# Patient Record
Sex: Female | Born: 1935 | Race: White | Hispanic: No | Smoking: Never smoker
Health system: Southern US, Community
[De-identification: ages and names within clinical notes are randomized; demographics above are authoritative.]

## PROBLEM LIST (undated history)

## (undated) DIAGNOSIS — I639 Cerebral infarction, unspecified: Secondary | ICD-10-CM

## (undated) DIAGNOSIS — K5792 Diverticulitis of intestine, part unspecified, without perforation or abscess without bleeding: Secondary | ICD-10-CM

## (undated) DIAGNOSIS — I10 Essential (primary) hypertension: Secondary | ICD-10-CM

## (undated) DIAGNOSIS — I2510 Atherosclerotic heart disease of native coronary artery without angina pectoris: Secondary | ICD-10-CM

## (undated) DIAGNOSIS — G8929 Other chronic pain: Secondary | ICD-10-CM

## (undated) DIAGNOSIS — M1990 Unspecified osteoarthritis, unspecified site: Secondary | ICD-10-CM

## (undated) DIAGNOSIS — N390 Urinary tract infection, site not specified: Secondary | ICD-10-CM

## (undated) DIAGNOSIS — K219 Gastro-esophageal reflux disease without esophagitis: Secondary | ICD-10-CM

## (undated) DIAGNOSIS — M549 Dorsalgia, unspecified: Secondary | ICD-10-CM

## (undated) DIAGNOSIS — E079 Disorder of thyroid, unspecified: Secondary | ICD-10-CM

## (undated) DIAGNOSIS — F0280 Dementia in other diseases classified elsewhere without behavioral disturbance: Secondary | ICD-10-CM

## (undated) DIAGNOSIS — F419 Anxiety disorder, unspecified: Secondary | ICD-10-CM

## (undated) DIAGNOSIS — G309 Alzheimer's disease, unspecified: Secondary | ICD-10-CM

## (undated) HISTORY — PX: ABDOMINAL HYSTERECTOMY: SHX81

## (undated) HISTORY — PX: MASTECTOMY: SHX3

## (undated) HISTORY — PX: APPENDECTOMY: SHX54

## (undated) HISTORY — PX: BREAST SURGERY: SHX581

## (undated) HISTORY — PX: BREAST CYST EXCISION: SHX579

## (undated) HISTORY — PX: HX GALL BLADDER SURGERY/CHOLE: SHX55

## (undated) HISTORY — DX: Essential (primary) hypertension: I10

## (undated) HISTORY — DX: Diverticulitis of intestine, part unspecified, without perforation or abscess without bleeding: K57.92

## (undated) HISTORY — DX: Cerebral infarction, unspecified (CMS HCC): I63.9

## (undated) HISTORY — PX: HIP SURGERY: SHX245

## (undated) HISTORY — PX: EYE SURGERY: SHX253

## (undated) HISTORY — PX: HX HYSTERECTOMY: SHX81

---

## 1998-04-29 ENCOUNTER — Ambulatory Visit (HOSPITAL_COMMUNITY): Payer: Self-pay

## 2000-11-22 ENCOUNTER — Ambulatory Visit (HOSPITAL_COMMUNITY): Payer: Self-pay

## 2005-11-03 ENCOUNTER — Ambulatory Visit: Payer: Self-pay | Admitting: Family Medicine

## 2005-12-01 ENCOUNTER — Ambulatory Visit: Payer: Self-pay | Admitting: Family Medicine

## 2005-12-14 ENCOUNTER — Ambulatory Visit: Payer: Self-pay | Admitting: Family Medicine

## 2005-12-23 ENCOUNTER — Ambulatory Visit: Payer: Self-pay | Admitting: Family Medicine

## 2005-12-30 ENCOUNTER — Ambulatory Visit: Payer: Self-pay | Admitting: Family Medicine

## 2006-01-27 ENCOUNTER — Ambulatory Visit: Payer: Self-pay | Admitting: Family Medicine

## 2006-02-12 ENCOUNTER — Ambulatory Visit: Payer: Self-pay | Admitting: Family Medicine

## 2006-03-04 ENCOUNTER — Ambulatory Visit: Payer: Self-pay | Admitting: Family Medicine

## 2006-08-12 ENCOUNTER — Ambulatory Visit: Payer: Self-pay | Admitting: Family Medicine

## 2006-08-18 ENCOUNTER — Ambulatory Visit: Payer: Self-pay | Admitting: Family Medicine

## 2006-09-21 DIAGNOSIS — I80299 Phlebitis and thrombophlebitis of other deep vessels of unspecified lower extremity: Secondary | ICD-10-CM | POA: Insufficient documentation

## 2006-09-21 DIAGNOSIS — E039 Hypothyroidism, unspecified: Secondary | ICD-10-CM | POA: Insufficient documentation

## 2006-09-21 DIAGNOSIS — I10 Essential (primary) hypertension: Secondary | ICD-10-CM | POA: Insufficient documentation

## 2010-03-03 ENCOUNTER — Encounter: Admission: RE | Admit: 2010-03-03 | Discharge: 2010-03-03 | Payer: Self-pay

## 2011-08-31 ENCOUNTER — Inpatient Hospital Stay (INDEPENDENT_AMBULATORY_CARE_PROVIDER_SITE_OTHER)
Admission: RE | Admit: 2011-08-31 | Discharge: 2011-08-31 | Disposition: A | Discharge status: Home / Self Care | Payer: Medicare Other | Source: Ambulatory Visit | Attending: Family Medicine | Admitting: Family Medicine

## 2011-08-31 ENCOUNTER — Encounter: Payer: Self-pay | Admitting: Family Medicine

## 2011-08-31 ENCOUNTER — Ambulatory Visit
Admission: RE | Admit: 2011-08-31 | Discharge: 2011-08-31 | Disposition: A | Discharge status: Home / Self Care | Payer: Medicare Other | Source: Ambulatory Visit | Attending: Family Medicine | Admitting: Family Medicine

## 2011-08-31 ENCOUNTER — Other Ambulatory Visit: Payer: Self-pay | Admitting: Family Medicine

## 2011-08-31 DIAGNOSIS — S79919A Unspecified injury of unspecified hip, initial encounter: Secondary | ICD-10-CM

## 2011-08-31 DIAGNOSIS — S79929A Unspecified injury of unspecified thigh, initial encounter: Secondary | ICD-10-CM

## 2011-08-31 DIAGNOSIS — M25559 Pain in unspecified hip: Secondary | ICD-10-CM

## 2011-08-31 DIAGNOSIS — I10 Essential (primary) hypertension: Secondary | ICD-10-CM | POA: Insufficient documentation

## 2011-08-31 LAB — CONVERTED CEMR LAB: Blood Glucose, Fingerstick: 130

## 2011-11-16 NOTE — Progress Notes (Signed)
Summary: BILAT HIP PAIN, SOB....WSE(rm 3)   Vital Signs:  Patient Profile:   77 Years Old Female CC:      hip pain post fall last night , SOB x this AM O2 treatment:    Room Air Temp:     97.9 degrees F oral Pulse rate:   88 / minute Resp:     28 per minute BP sitting:   138 / 86  (left arm) Cuff size:   regular  Pt. in pain?   yes    Location:   bilateral hips  Vitals Entered By: Lajean Saver RN (August 31, 2011 1:14 PM)              Is Patient Diabetic? No CBG Result 130      Updated Prior Medication List: CLONAZEPAM 1 MG TABS (CLONAZEPAM) once daily at bedtime COUMADIN  TABS (WARFARIN SODIUM TABS) as prescribed DETROL LA 4 MG CP24 (TOLTERODINE TARTRATE) one with dinner DURAGESIC-25 25 MCG/HR PT72 (FENTANYL) one patch every 3 days LISINOPRIL-HYDROCHLOROTHIAZIDE 20-12.5 MG TABS (LISINOPRIL-HYDROCHLOROTHIAZIDE) once daily LEXAPRO  TABS (ESCITALOPRAM OXALATE TABS) 15 mg once daily LORCET PLUS 7.5-650 MG TABS (HYDROCODONE-ACETAMINOPHEN) one every 4 hours as needed MAGNESIUM CHLORIDE  FLAK (MAGNESIUM CHLORIDE) two times a day PREMARIN 1.25 MG TABS (ESTROGENS CONJUGATED) 1/2 tablet daily PREVACID 30 MG CPDR (LANSOPRAZOLE) once daily QUESTRAN  PACK (CHOLESTYRAMINE PACK) two times a day SEROQUEL 25 MG TABS (QUETIAPINE FUMARATE) one at bedtime SYNTHROID 88 MCG TABS (LEVOTHYROXINE SODIUM) once daily XALATAN 0.005 % SOLN (LATANOPROST) 1 drop each eye  Current Allergies (reviewed today): ! SULFA ! FURADANTIN ! ERYTHROMYCIN ! LINCOMYCIN HCL (LINCOMYCIN HCL) ! VIBRAMYCIN ! MINOCIN ! MORPHINEHistory of Present Illness Chief Complaint: hip pain post fall last night , SOB x this AM History of Present Illness:  Subjective:  Patient presents with her son, who reports that she fell last night while arising from toilet.  She had been sitting for a while and became briefly dizzy as she arose.  She fell, striking her left hip.  She complains of persistent pain in the hip, and has  pain with ambulation.  Her son noted that she seemed short of breath upon walking into clinic this morning, but patient states that she no longer feels dyspneic.  No chest pain.  No nausea/vomiting.  REVIEW OF SYSTEMS Constitutional Symptoms      Denies fever, chills, night sweats, weight loss, weight gain, and fatigue.  Eyes       Denies change in vision, eye pain, eye discharge, glasses, contact lenses, and eye surgery. Ear/Nose/Throat/Mouth       Complains of dizziness.      Denies hearing loss/aids, change in hearing, ear pain, ear discharge, frequent runny nose, frequent nose bleeds, sinus problems, sore throat, hoarseness, and tooth pain or bleeding.  Respiratory       Complains of shortness of breath.      Denies dry cough, productive cough, wheezing, asthma, bronchitis, and emphysema/COPD.  Cardiovascular       Denies murmurs, chest pain, and tires easily with exhertion.    Gastrointestinal       Denies stomach pain, nausea/vomiting, diarrhea, constipation, blood in bowel movements, and indigestion. Genitourniary       Denies painful urination, blood or discharge from vagina, kidney stones, and loss of urinary control. Neurological       Denies paralysis, seizures, and fainting/blackouts. Musculoskeletal       Complains of muscle pain, joint pain, joint stiffness, decreased range of motion,  and swelling.      Denies redness, muscle weakness, and gout.  Skin       Complains of bruising.      Denies unusual mles/lumps or sores and hair/skin or nail changes.      Comments: left hip Psych       Denies mood changes, temper/anger issues, anxiety/stress, speech problems, depression, and sleep problems. Other Comments: Patient fell early this AM landing on back. Denies hitting her head, no soreness upon palpation. She appears SOB, and pale. C/o some nausea.  Denies any CP. Bruising on her left hip. EKG and CBG done   Past History:  Past Medical History: DVT 10/06 on  coumadin Hypertension Hypothyroidism  Past Surgical History: Reviewed history from 09/21/2006 and no changes required. 3 breast surgeries, Appendectomy, Cholecystectomy, Eye sx, Hand sx, Hysterectomy - Partial  Family History: Reviewed history from 09/21/2006 and no changes required. Father, Mother-lung Ca  Social History: Retired.  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  Never smoked, no EtOH, no drugs, 4 caffeinated drinks per day, no regular exercise.  Husband with throat Cancer and Merkle Cell, and Sweet's Syndrome.   Objective:  Appearance:  Elderly female, alert and in no acute distress.  However she has pain in left hip when re-positionioning on exam table. Eyes:  Pupils are equal, round, and reactive to light and accomodation.  Extraocular movement is intact.  Conjunctivae are not inflamed.  Mouth:  moist mucous membranes  Neck:  Supple.  No tenderness  Lungs:  Clear to auscultation.  Breath sounds are equal.  Heart:  Regular rate and rhythm without murmurs, rubs, or gallops.  Chest:  No tenderness Abdomen:  Nontender without masses or hepatosplenomegaly.  Bowel sounds are present.  No CVA or flank tenderness.  Left hip:  Large tender hematoma just posterior to the greater trochanter.  Decreased range of motion hip.   Extremities:  No edema.   EKG:  No acute changes. X-ray left hip:  Negative X-ray pelvis:  IMPRESSION: Moderate degenerative changes involving both hips. No acute fracture.  Assessment New Problems: HYPOTHYROIDISM (ICD-244.9) HYPERTENSION (ICD-401.9) HIP/THIGH INJURY (ICD-959.6) HIP PAIN, LEFT (ICD-719.45)  LARGE HEMATOMA LEFT HIP AREA, MAY NEED ASPIRATION  Plan New Orders: T-DG Hip Complete*L* [13086] T-DG Pelvis 1-2 Views [72170] New Patient Level IV [57846] Planning Comments:   Continue applying ice pack several times daily.  Continue analgesic as needed. Refer to orthopedist.   The patient and/or caregiver has been counseled thoroughly with regard to  medications prescribed including dosage, schedule, interactions, rationale for use, and possible side effects and they verbalize understanding.  Diagnoses and expected course of recovery discussed and will return if not improved as expected or if the condition worsens. Patient and/or caregiver verbalized understanding.   Orders Added: 1)  T-DG Hip Complete*L* [73510] 2)  T-DG Pelvis 1-2 Views [72170] 3)  New Patient Level IV [96295] Appointment scheduled with orthopaedist. Dr. Tasia Catchings with [facility] on 09/04/2011 @ 2:50

## 2011-11-16 NOTE — Progress Notes (Signed)
Summary: Additional Study  EKG:  No acute changes Donna Christen MD  August 31, 2011 9:45 PM

## 2011-12-06 ENCOUNTER — Emergency Department (INDEPENDENT_AMBULATORY_CARE_PROVIDER_SITE_OTHER)
Admission: EM | Admit: 2011-12-06 | Discharge: 2011-12-06 | Disposition: A | Discharge status: Home / Self Care | Payer: Medicare Other | Source: Home / Self Care | Attending: Emergency Medicine | Admitting: Emergency Medicine

## 2011-12-06 ENCOUNTER — Encounter: Payer: Self-pay | Admitting: Emergency Medicine

## 2011-12-06 DIAGNOSIS — N3000 Acute cystitis without hematuria: Secondary | ICD-10-CM

## 2011-12-06 HISTORY — DX: Unspecified osteoarthritis, unspecified site: M19.90

## 2011-12-06 HISTORY — DX: Disorder of thyroid, unspecified: E07.9

## 2011-12-06 HISTORY — DX: Essential (primary) hypertension: I10

## 2011-12-06 HISTORY — DX: Gastro-esophageal reflux disease without esophagitis: K21.9

## 2011-12-06 HISTORY — DX: Dementia in other diseases classified elsewhere, unspecified severity, without behavioral disturbance, psychotic disturbance, mood disturbance, and anxiety: F02.80

## 2011-12-06 HISTORY — DX: Anxiety disorder, unspecified: F41.9

## 2011-12-06 HISTORY — DX: Alzheimer's disease, unspecified: G30.9

## 2011-12-06 LAB — POCT URINALYSIS DIPSTICK
Bilirubin, UA: NEGATIVE
Blood, UA: NEGATIVE
Glucose, UA: NEGATIVE
Ketones, UA: NEGATIVE
Leukocytes, UA: NEGATIVE
Nitrite, UA: NEGATIVE
Protein, UA: NEGATIVE
Spec Grav, UA: 1.02 (ref 1.005–1.03)
Urobilinogen, UA: 0.2 (ref 0–1)
pH, UA: 5.5 (ref 5–8)

## 2011-12-06 MED ORDER — CIPROFLOXACIN HCL 250 MG PO TABS: 250.0000 mg | ORAL_TABLET | Freq: Two times a day (BID) | ORAL | Status: AC

## 2011-12-06 NOTE — ED Notes (Signed)
Experiencing urinary frequency x 24 hours; q 30 minutes. Was D/Cd from hospital 3 days ago having had pneumonia and dehydration. Has had flu vaccine this season.

## 2011-12-06 NOTE — ED Provider Notes (Signed)
History     CSN: [id number]  Arrival date & time 12/06/11  1242   First MD Initiated Contact with Patient 12/06/11 1322      Chief Complaint  Patient presents with  . Urinary Frequency    (Consider location/radiation/quality/duration/timing/severity/associated sxs/prior treatment) HPI Comments: Patient and husband feel that this is the beginning of a UTI. She has had several in the past. Denies any GYN symptoms. No fever or chills. She has multiple drug allergies as noted, but has taken Cipro in the past without problems.  She had been off twice for pneumonia for 5 days and was discharged from the hospital over a week ago and had been doing very well.  Patient is a 77 y.o. female presenting with frequency. The history is provided by the patient and the spouse.  Urinary Frequency This is a new problem. The current episode started 12 to 24 hours ago. The problem occurs constantly. The problem has been gradually worsening. Pertinent negatives include no chest pain, no abdominal pain, no headaches and no shortness of breath. The symptoms are aggravated by nothing. The symptoms are relieved by nothing. She has tried nothing for the symptoms.    Past Medical History  Diagnosis Date  . Thyroid disease   . Arthritis   . Hypertension   . Anxiety   . Alzheimer disease   . GERD (gastroesophageal reflux disease)     Past Surgical History  Procedure Date  . Appendectomy   . Abdominal hysterectomy   . Mastectomy     No family history on file.  History  Substance Use Topics  . Smoking status: Never Smoker   . Smokeless tobacco: Not on file  . Alcohol Use: No    OB History    Grav Para Term Preterm Abortions TAB SAB Ect Mult Living                  Review of Systems  Constitutional: Negative for fever, chills and appetite change.  HENT: Negative.   Eyes: Negative.   Respiratory: Negative.  Negative for shortness of breath.   Cardiovascular: Negative.  Negative for chest  pain.  Gastrointestinal: Negative.  Negative for abdominal pain.  Genitourinary: Positive for urgency and frequency. Negative for dysuria, hematuria, flank pain, decreased urine volume, vaginal bleeding, vaginal discharge, vaginal pain and pelvic pain.  Musculoskeletal: Negative.   Neurological: Negative for syncope, light-headedness and headaches.  Hematological: Negative.   Psychiatric/Behavioral: Negative for hallucinations and agitation.    Allergies  Doxycycline hyclate; Erythromycin; Lincomycin hcl; Minocycline hcl; Morphine; Nitrofurantoin; and Sulfonamide derivatives  Home Medications   Current Outpatient Rx  Name Route Sig Dispense Refill  . ASPIRIN 81 MG PO TABS Oral Take 81 mg by mouth daily.      . ATENOLOL 50 MG PO TABS Oral Take 50 mg by mouth daily.      Marland Kitchen VITAMIN D 1000 UNITS PO TABS Oral Take 1,000 Units by mouth daily.      . CHOLESTYRAMINE LIGHT 4 G PO PACK Oral Take 4 g by mouth 1 day or 1 dose.      Marland Kitchen CLONAZEPAM 0.5 MG PO TABS Oral Take 0.25 mg by mouth daily.      Marland Kitchen ESCITALOPRAM OXALATE 20 MG PO TABS Oral Take 20 mg by mouth daily.      . FENTANYL 25 MCG/HR TD PT72 Transdermal Place 1 patch onto the skin every 3 (three) days.      Marland Kitchen HYDROCODONE-ACETAMINOPHEN 7.5-650 MG PO TABS Oral  Take 1 tablet by mouth daily.      Marland Kitchen LANSOPRAZOLE 30 MG PO CPDR Oral Take 30 mg by mouth daily.      Marland Kitchen LATANOPROST 0.005 % OP SOLN  1 drop at bedtime.      Marland Kitchen LEVOTHYROXINE SODIUM 112 MCG PO TABS Oral Take 112 mcg by mouth daily.      Marland Kitchen MEMANTINE HCL 10 MG PO TABS Oral Take 10 mg by mouth 2 (two) times daily.      Marland Kitchen POLYETHYLENE GLYCOL 3350 PO PACK Oral Take 17 g by mouth daily.      . WARFARIN SODIUM 2 MG PO TABS Oral Take 2 mg by mouth.      Marland Kitchen CIPROFLOXACIN HCL 250 MG PO TABS Oral Take 1 tablet (250 mg total) by mouth 2 (two) times daily. 14 tablet 0    BP 148/71  Pulse 63  Temp(Src) 98.2 F (36.8 C) (Oral)  Resp 16  Ht 5\' 4"  (1.626 m)  Wt 132 lb (59.875 kg)  BMI 22.66 kg/m2   SpO2 99%  Physical Exam  Nursing note and vitals reviewed. Constitutional: She is oriented to person, place, and time. She appears well-developed and well-nourished. No distress.  HENT:  Mouth/Throat: Oropharynx is clear and moist.  Eyes: No scleral icterus.  Neck: Neck supple.  Cardiovascular: Normal rate, regular rhythm and normal heart sounds.   Pulmonary/Chest: Breath sounds normal.  Abdominal: Soft. She exhibits no mass. There is no hepatosplenomegaly. There is tenderness in the suprapubic area. There is no rebound, no guarding and no CVA tenderness.  Lymphadenopathy:    She has no cervical adenopathy.  Neurological: She is alert and oriented to person, place, and time.  Skin: Skin is warm and dry.    ED Course  Procedures (including critical care time)   Labs Reviewed  POCT URINALYSIS DIPSTICK  URINE CULTURE   Urinalysis dipstick is within normal limits.   1. Acute cystitis       MDM  Discuss with patient and husband. Likely this is an early UTI and will treat with Cipro. See also AVS. Urine culture sent. Patient and husband voiced understanding and agreement with treatment plan.        Lonell Face, MD 12/06/11 [phone]

## 2011-12-08 LAB — URINE CULTURE: Colony Count: 3000

## 2012-08-18 ENCOUNTER — Emergency Department (INDEPENDENT_AMBULATORY_CARE_PROVIDER_SITE_OTHER)
Admission: EM | Admit: 2012-08-18 | Discharge: 2012-08-18 | Disposition: A | Discharge status: Home / Self Care | Payer: Medicare Other | Source: Home / Self Care

## 2012-08-18 DIAGNOSIS — N390 Urinary tract infection, site not specified: Secondary | ICD-10-CM

## 2012-08-18 DIAGNOSIS — R350 Frequency of micturition: Secondary | ICD-10-CM

## 2012-08-18 LAB — POCT URINALYSIS DIP (MANUAL ENTRY)
Bilirubin, UA: NEGATIVE
Blood, UA: NEGATIVE
Glucose, UA: NEGATIVE
Ketones, POC UA: NEGATIVE
Protein Ur, POC: NEGATIVE
Spec Grav, UA: 1.01 (ref 1.005–1.03)
Urobilinogen, UA: 2 (ref 0–1)
pH, UA: 5.5 (ref 5–8)

## 2012-08-18 LAB — POCT CBC W AUTO DIFF (K'VILLE URGENT CARE)

## 2012-08-18 MED ORDER — CIPROFLOXACIN HCL 500 MG PO TABS: 500.0000 mg | ORAL_TABLET | Freq: Two times a day (BID) | ORAL | Status: AC

## 2012-08-18 NOTE — ED Provider Notes (Addendum)
History     CSN: [id number]  Arrival date & time 08/18/12  1257   None     Chief Complaint  Patient presents with  . Urinary Frequency   HPI DYSURIA Onset:  2-3 days  Description: increased urinary frequency, generalized malaise, mild intermittent dizziness.  Modifying factors: Recently had UTI 2 weeks ago that was treated with cipro per pt. Sxs resolved but returned yesterday. Symptoms are similar to previous UTIs.   Symptoms Urgency:  yes Frequency: yes  Hesitancy:  yes Hematuria:  no Flank Pain:  mild Fever: no Nausea/Vomiting:  Mild nausea Missed LMP: no STD exposure: no Discharge: no Irritants: no Rash: no  Red Flags   More than 3 UTI's last 12 months:  This will be 2nd UTI in 12 months  PMH of  Diabetes or Immunosuppression:  no Renal Disease/Calculi: no Urinary Tract Abnormality:  no Instrumentation or Trauma: no    Past Medical History  Diagnosis Date  . Thyroid disease   . Arthritis   . Hypertension   . Anxiety   . Alzheimer disease   . GERD (gastroesophageal reflux disease)     Past Surgical History  Procedure Date  . Appendectomy   . Abdominal hysterectomy   . Mastectomy     History reviewed. No pertinent family history.  History  Substance Use Topics  . Smoking status: Never Smoker   . Smokeless tobacco: Not on file  . Alcohol Use: No    OB History    Grav Para Term Preterm Abortions TAB SAB Ect Mult Living                  Review of Systems  All other systems reviewed and are negative.    Allergies  Doxycycline hyclate; Erythromycin; Lincomycin hcl; Minocycline hcl; Morphine; Nitrofurantoin; and Sulfonamide derivatives  Home Medications   Current Outpatient Rx  Name Route Sig Dispense Refill  . ASPIRIN 81 MG PO TABS Oral Take 81 mg by mouth daily.      . ATENOLOL 50 MG PO TABS Oral Take 50 mg by mouth daily.      Marland Kitchen VITAMIN D 1000 UNITS PO TABS Oral Take 1,000 Units by mouth daily.      . CHOLESTYRAMINE LIGHT 4 G PO  PACK Oral Take 4 g by mouth 1 day or 1 dose.      Marland Kitchen CLONAZEPAM 0.5 MG PO TABS Oral Take 0.25 mg by mouth daily.      Marland Kitchen ESCITALOPRAM OXALATE 20 MG PO TABS Oral Take 20 mg by mouth daily.      . FENTANYL 25 MCG/HR TD PT72 Transdermal Place 1 patch onto the skin every 3 (three) days.      Marland Kitchen GABAPENTIN 100 MG PO CAPS Oral Take 100 mg by mouth 3 (three) times daily.    Marland Kitchen HYDROCODONE-ACETAMINOPHEN 7.5-650 MG PO TABS Oral Take 1 tablet by mouth daily.      Marland Kitchen LANSOPRAZOLE 30 MG PO CPDR Oral Take 30 mg by mouth daily.      Marland Kitchen LATANOPROST 0.005 % OP SOLN  1 drop at bedtime.      Marland Kitchen LEVOTHYROXINE SODIUM 112 MCG PO TABS Oral Take 112 mcg by mouth daily.      Marland Kitchen MEMANTINE HCL 10 MG PO TABS Oral Take 10 mg by mouth 2 (two) times daily.      Marland Kitchen POLYETHYLENE GLYCOL 3350 PO PACK Oral Take 17 g by mouth daily.      Marland Kitchen RIVASTIGMINE TARTRATE 4.5  MG PO CAPS Oral Take 4.5 mg by mouth once.    . WARFARIN SODIUM 2 MG PO TABS Oral Take 2 mg by mouth.        BP 163/85  Pulse 61  Temp 98 F (36.7 C) (Oral)  Resp 20  Ht 5\' 4"  (1.626 m)  Wt 137 lb (62.143 kg)  BMI 23.52 kg/m2  SpO2 97%  Physical Exam  Constitutional: She appears well-developed and well-nourished.  HENT:  Head: Normocephalic and atraumatic.  Eyes: Conjunctivae are normal. Pupils are equal, round, and reactive to light.  Neck: Normal range of motion. Neck supple.  Cardiovascular: Normal rate and regular rhythm.   Pulmonary/Chest: Effort normal and breath sounds normal.  Abdominal: Soft. Bowel sounds are normal.       Mild R sided flank pain    Musculoskeletal: Normal range of motion.  Neurological: She is alert.  Skin: Skin is warm.    ED Course  Procedures (including critical care time)   Labs Reviewed  POCT CBC W AUTO DIFF ([facility]) - Abnormal; Notable for the following:   POCT URINALYSIS DIP (MANUAL ENTRY)  URINE CULTURE   No results found.   1. Frequency of urination   2. Complicated UTI (urinary tract infection)         MDM  Will clinically treat for complicated UTI with cipro x 10 days. Noted multiple medication allergies.   Urine culture.  CBC and physical exam are generally reassuring.  Discussed infectious red flags at length with pt as well as son.  Would consider urology referall +/- CT Abd/Pelvis with IV contrast if this recurs in similar fashion.     The patient and/or caregiver has been counseled thoroughly with regard to treatment plan and/or medications prescribed including dosage, schedule, interactions, rationale for use, and possible side effects and they verbalize understanding. Diagnoses and expected course of recovery discussed and will return if not improved as expected or if the condition worsens. Patient and/or caregiver verbalized understanding.              Doree Albee, MD 08/18/12 1431   Update:  Pt with noted visit 11/2011 for UTI.  This would be 3 UTIs in 12 months.  Formal referral to urology made and pt contacted.     Doree Albee, MD 08/18/12 [phone]

## 2012-08-18 NOTE — ED Notes (Signed)
Arionne feels weak and dizzy for a couple of days. She is urinating on herself before she can make it to the bathroom. She complains of back pain and believes this to be a UTI. She has also had sweats, no fever or chills.

## 2012-08-20 LAB — URINE CULTURE: Colony Count: NO GROWTH

## 2012-08-21 ENCOUNTER — Telehealth: Payer: Self-pay | Admitting: Emergency Medicine

## 2013-06-15 ENCOUNTER — Emergency Department (INDEPENDENT_AMBULATORY_CARE_PROVIDER_SITE_OTHER)
Admission: EM | Admit: 2013-06-15 | Discharge: 2013-06-15 | Disposition: A | Discharge status: Home / Self Care | Payer: Medicare Other | Source: Home / Self Care | Attending: Family Medicine | Admitting: Family Medicine

## 2013-06-15 ENCOUNTER — Encounter: Payer: Self-pay | Admitting: *Deleted

## 2013-06-15 DIAGNOSIS — M545 Low back pain, unspecified: Secondary | ICD-10-CM

## 2013-06-15 DIAGNOSIS — Z9181 History of falling: Secondary | ICD-10-CM

## 2013-06-15 DIAGNOSIS — W19XXXA Unspecified fall, initial encounter: Secondary | ICD-10-CM

## 2013-06-15 HISTORY — DX: Dorsalgia, unspecified: M54.9

## 2013-06-15 HISTORY — DX: Other chronic pain: G89.29

## 2013-06-15 NOTE — ED Provider Notes (Signed)
History    CSN: [id number] Arrival date & time 06/15/13  1113  First MD Initiated Contact with Patient 06/15/13 1149     Chief Complaint  Patient presents with  . Back Pain    HPI  This is an 79 year old female with multiple medical problems who presents today with back pain status post fall. Per report, patient was using the bathroom last night when she accidentally fell off the toilet. Patient is unaware whether or not she hit her head her head are back. Since this point she's had low back pain. Patient has a prior history of degenerative disc disease at multiple steroid injections the past. Patient currently denies any radicular symptoms. Patient also reports a new onset shortness of breath as well as some bilateral lotion the swelling. Patient is noted have a prior history of blood clots. Patient is currently on Coumadin for this. Per the husband, INRs to manage by his wife who is a Engineer, civil (consulting). Minimal to mild shortness of breath. Past Medical History  Diagnosis Date  . Thyroid disease   . Arthritis   . Hypertension   . Anxiety   . Alzheimer disease   . GERD (gastroesophageal reflux disease)    Past Surgical History  Procedure Laterality Date  . Appendectomy    . Abdominal hysterectomy    . Mastectomy     No family history on file. History  Substance Use Topics  . Smoking status: Never Smoker   . Smokeless tobacco: Not on file  . Alcohol Use: No   OB History   Grav Para Term Preterm Abortions TAB SAB Ect Mult Living                 Review of Systems  All other systems reviewed and are negative.    Allergies  Doxycycline hyclate; Erythromycin; Lincomycin hcl; Minocycline hcl; Morphine; Nitrofurantoin; and Sulfonamide derivatives  Home Medications   Current Outpatient Rx  Name  Route  Sig  Dispense  Refill  . aspirin 81 MG tablet   Oral   Take 81 mg by mouth daily.           Marland Kitchen atenolol (TENORMIN) 50 MG tablet   Oral   Take 50 mg by mouth daily.            . cholecalciferol (VITAMIN D) 1000 UNITS tablet   Oral   Take 1,000 Units by mouth daily.           . cholestyramine light (PREVALITE) 4 G packet   Oral   Take 4 g by mouth 1 day or 1 dose.           . clonazePAM (KLONOPIN) 0.5 MG tablet   Oral   Take 0.25 mg by mouth daily.           Marland Kitchen escitalopram (LEXAPRO) 20 MG tablet   Oral   Take 20 mg by mouth daily.           . fentaNYL (DURAGESIC - DOSED MCG/HR) 25 MCG/HR   Transdermal   Place 1 patch onto the skin every 3 (three) days.           Marland Kitchen gabapentin (NEURONTIN) 100 MG capsule   Oral   Take 100 mg by mouth 3 (three) times daily.         . hydrocodone-acetaminophen (LORCET PLUS) 7.5-650 MG per tablet   Oral   Take 1 tablet by mouth daily.           Marland Kitchen  lansoprazole (PREVACID) 30 MG capsule   Oral   Take 30 mg by mouth daily.           Marland Kitchen latanoprost (XALATAN) 0.005 % ophthalmic solution      1 drop at bedtime.           Marland Kitchen levothyroxine (SYNTHROID, LEVOTHROID) 112 MCG tablet   Oral   Take 112 mcg by mouth daily.           . memantine (NAMENDA) 10 MG tablet   Oral   Take 10 mg by mouth 2 (two) times daily.           . polyethylene glycol (MIRALAX / GLYCOLAX) packet   Oral   Take 17 g by mouth daily.           . rivastigmine (EXELON) 4.5 MG capsule   Oral   Take 4.5 mg by mouth once.         . warfarin (COUMADIN) 2 MG tablet   Oral   Take 2 mg by mouth.            There were no vitals taken for this visit. Physical Exam  Constitutional:  Frail Ambulating with a walker  HENT:  Head: Normocephalic and atraumatic.  Eyes: Conjunctivae are normal. Pupils are equal, round, and reactive to light.  Neck: Normal range of motion. Neck supple.  Cardiovascular: Normal rate, regular rhythm and normal heart sounds.   Pulmonary/Chest: Effort normal.  Abdominal: Soft.  Musculoskeletal:  Bilateral lower extremity swelling (2+) Bilateral popliteal tenderness palpation.   Neurological: She is alert.  Skin: Skin is warm.    ED Course  Procedures (including critical care time) Labs Reviewed - No data to display No results found. 1. Fall, initial encounter     MDM  Patient is known to have multiple medical problems. Given recent fall there is some concern for possible fracture versus recurrence of DVT as well as multiple other comorbidities it could be contributing to her presentation. Discussed with both patient and son that patient will need further evaluation ER to assess for any blood clots, fractures, infections. Patient and son expressed understanding.     The patient and/or caregiver has been counseled thoroughly with regard to treatment plan and/or medications prescribed including dosage, schedule, interactions, rationale for use, and possible side effects and they verbalize understanding. Diagnoses and expected course of recovery discussed and will return if not improved as expected or if the condition worsens. Patient and/or caregiver verbalized understanding.       Doree Albee, MD 06/15/13 1154

## 2013-06-15 NOTE — ED Notes (Signed)
Pt c/o low back pain for months. She reports seeing a [facility] MD in [place] for 2 epidural injections recently, xrays and MRI's. She reports falling off of the commode last night with increased back pain and pain and swelling in her legs.  she is unsure of LOC.

## 2013-12-29 ENCOUNTER — Emergency Department (INDEPENDENT_AMBULATORY_CARE_PROVIDER_SITE_OTHER)
Admission: EM | Admit: 2013-12-29 | Discharge: 2013-12-29 | Disposition: A | Discharge status: Home / Self Care | Payer: Medicare Other | Source: Home / Self Care | Attending: Emergency Medicine | Admitting: Emergency Medicine

## 2013-12-29 ENCOUNTER — Encounter: Payer: Self-pay | Admitting: Emergency Medicine

## 2013-12-29 DIAGNOSIS — J209 Acute bronchitis, unspecified: Secondary | ICD-10-CM

## 2013-12-29 HISTORY — DX: Urinary tract infection, site not specified: N39.0

## 2013-12-29 MED ORDER — AZITHROMYCIN 250 MG PO TABS: ORAL_TABLET | ORAL | Status: AC

## 2013-12-29 NOTE — ED Provider Notes (Signed)
CSN: [id number]     Arrival date & time 12/29/13  1402 History   First MD Initiated Contact with Patient 12/29/13 1422     Chief Complaint  Patient presents with  . Fatigue  . Hoarse  . Cough    The history is provided by the patient (And her adult son, who is her caretaker).   URI HISTORY  Victorino DecemberLola is a 80 y.o. female who complains of onset of cold symptoms for 2 days.  Has not tried any medication for this  No chills/sweats No  Fever  +  Nasal congestion No  Discolored Post-nasal drainage No sinus pain/pressure No sore throat  +  Mild cough No wheezing + Hoarseness + chest congestion No hemoptysis No shortness of breath No pleuritic pain  No itchy/red eyes No earache  No nausea No vomiting No abdominal pain No diarrhea  No skin rashes +  Fatigue No myalgias No headache   Reviewed medication list with son. She is no longer on Coumadin. For the past 2 weeks, on low-dose Augmentin daily per urologist/infectious disease specialist, in order to help prevent recurrent UTIs, and this has been effective.--No side effects or allergic reactions noted on Augmentin.    Past Medical History  Diagnosis Date  . Thyroid disease   . Arthritis   . Hypertension   . Anxiety   . Alzheimer disease   . GERD (gastroesophageal reflux disease)   . Chronic back pain   . Recurrent UTI    Past Surgical History  Procedure Laterality Date  . Appendectomy    . Abdominal hysterectomy    . Mastectomy    . Breast surgery     Family History  Problem Relation Age of Onset  . Cancer Mother   . Cancer Father    History  Substance Use Topics  . Smoking status: Never Smoker   . Smokeless tobacco: Not on file  . Alcohol Use: No   OB History   Grav Para Term Preterm Abortions TAB SAB Ect Mult Living                 Review of Systems  All other systems reviewed and are negative.    Allergies  Doxycycline hyclate; Erythromycin; Lincomycin hcl; Minocycline hcl; Morphine;  Nitrofurantoin; and Sulfonamide derivatives  Home Medications   Current Outpatient Rx  Name  Route  Sig  Dispense  Refill  . cholestyramine (QUESTRAN) 4 G packet   Oral   Take 4 g by mouth 3 (three) times daily with meals.         . Probiotic Product (PROBIOTIC DAILY PO)   Oral   Take by mouth.         . zolpidem (AMBIEN) 10 MG tablet   Oral   Take 5 mg by mouth at bedtime as needed for sleep.         Marland Kitchen. aspirin 81 MG tablet   Oral   Take 81 mg by mouth daily.           Marland Kitchen. atenolol (TENORMIN) 50 MG tablet   Oral   Take 50 mg by mouth daily.           Marland Kitchen. azithromycin (ZITHROMAX Z-PAK) 250 MG tablet      Take 2 tablets on day one, then 1 tablet daily on days 2 through 5   1 each   0   . cholecalciferol (VITAMIN D) 1000 UNITS tablet   Oral   Take 1,000 Units by mouth  daily.           . cholestyramine light (PREVALITE) 4 G packet   Oral   Take 4 g by mouth 1 day or 1 dose.           . clonazePAM (KLONOPIN) 0.5 MG tablet   Oral   Take 0.25 mg by mouth daily.           . diclofenac sodium (VOLTAREN) 1 % GEL   Topical   Apply topically.         Marland Kitchen escitalopram (LEXAPRO) 20 MG tablet   Oral   Take 20 mg by mouth daily.           . fentaNYL (DURAGESIC - DOSED MCG/HR) 25 MCG/HR   Transdermal   Place 1 patch onto the skin every 3 (three) days.           Marland Kitchen FIBER PO   Oral   Take by mouth.         . fish oil-omega-3 fatty acids 1000 MG capsule   Oral   Take 2 g by mouth daily.         . furosemide (LASIX) 20 MG tablet   Oral   Take 10 mg by mouth.         . gabapentin (NEURONTIN) 100 MG capsule   Oral   Take 100 mg by mouth 3 (three) times daily.         . hydrocodone-acetaminophen (LORCET PLUS) 7.5-650 MG per tablet   Oral   Take 1 tablet by mouth daily.           . lansoprazole (PREVACID) 30 MG capsule   Oral   Take 30 mg by mouth daily.           Marland Kitchen latanoprost (XALATAN) 0.005 % ophthalmic solution      1 drop at  bedtime.           Marland Kitchen levothyroxine (SYNTHROID, LEVOTHROID) 112 MCG tablet   Oral   Take 112 mcg by mouth daily.           Marland Kitchen losartan (COZAAR) 50 MG tablet   Oral   Take 50 mg by mouth daily.         . memantine (NAMENDA) 10 MG tablet   Oral   Take 10 mg by mouth 2 (two) times daily.           . nitroGLYCERIN (NITROSTAT) 0.4 MG SL tablet   Sublingual   Place 0.4 mg under the tongue every 5 (five) minutes as needed for chest pain.         Marland Kitchen omeprazole (PRILOSEC) 20 MG capsule   Oral   Take 20 mg by mouth daily.         . polyethylene glycol (MIRALAX / GLYCOLAX) packet   Oral   Take 17 g by mouth daily.           . rivastigmine (EXELON) 4.5 MG capsule   Oral   Take 4.5 mg by mouth once.         . warfarin (COUMADIN) 2 MG tablet   Oral   Take 2 mg by mouth.            BP 109/59  Pulse 76  Temp(Src) 98 F (36.7 C) (Oral)  Resp 16  Ht 5\' 4"  (1.626 m)  Wt 132 lb (59.875 kg)  BMI 22.65 kg/m2  SpO2 95% Physical Exam  Nursing note and vitals reviewed. Constitutional: She  is oriented to person, place, and time. She appears well-developed and well-nourished. No distress.  HENT:  Head: Normocephalic and atraumatic.  Right Ear: Tympanic membrane normal.  Left Ear: Tympanic membrane normal.  Nose: Nose normal.  Mouth/Throat: Oropharynx is clear and moist. No oropharyngeal exudate.  Eyes: Right eye exhibits no discharge. Left eye exhibits no discharge. No scleral icterus.  Neck: Neck supple.  Cardiovascular: Normal rate, regular rhythm and normal heart sounds.   Pulmonary/Chest: No accessory muscle usage. No respiratory distress. She has no decreased breath sounds. She has no wheezes. She has rhonchi (Minimal, anterior lung fields). She has no rales.  Lymphadenopathy:    She has no cervical adenopathy.  Neurological: She is alert and oriented to person, place, and time.  Skin: Skin is warm and dry. No rash noted.  Psychiatric: She has a normal mood and  affect.   She is mildly hoarse. Rare coughing noted. ED Course  Procedures (including critical care time) Labs Review Labs Reviewed - No data to display Imaging Review No results found.  EKG Interpretation    Date/Time:    Ventricular Rate:    PR Interval:    QRS Duration:   QT Interval:    QTC Calculation:   R Axis:     Text Interpretation:              MDM   1. Acute bronchitis    Treatment options discussed with patient and son, as well as risks, benefits, alternatives. Patient and son voiced understanding and agreement with the following plans:  Zithromax Z-Pak prescribed. Although she has erythromycin listed as one of her drug allergies, son states that erythromycin has caused GI upset in the past and she's taken Zithromax in the past without any problems. (Note that she is no longer on Coumadin, as her PCP DC'd this, so there is no concern about interaction of Zithromax and Coumadin). Other symptomatic care discussed Precautions discussed. Red flags discussed. Questions invited and answered. Patient voiced understanding and agreement.   Lajean Manes, MD 12/29/13 [phone]

## 2013-12-29 NOTE — ED Notes (Signed)
Pt's son reports that she has had a cough,hoarseness, pale skin, and fatigue x today. Denies fever.

## 2014-06-12 ENCOUNTER — Other Ambulatory Visit (HOSPITAL_BASED_OUTPATIENT_CLINIC_OR_DEPARTMENT_OTHER): Payer: Self-pay | Admitting: Internal Medicine

## 2014-06-12 DIAGNOSIS — N949 Unspecified condition associated with female genital organs and menstrual cycle: Secondary | ICD-10-CM

## 2014-06-12 DIAGNOSIS — N289 Disorder of kidney and ureter, unspecified: Secondary | ICD-10-CM

## 2014-06-23 ENCOUNTER — Other Ambulatory Visit (HOSPITAL_BASED_OUTPATIENT_CLINIC_OR_DEPARTMENT_OTHER): Payer: Medicare Other

## 2015-04-21 ENCOUNTER — Emergency Department (HOSPITAL_BASED_OUTPATIENT_CLINIC_OR_DEPARTMENT_OTHER): Payer: Medicare Other

## 2015-04-21 ENCOUNTER — Emergency Department (HOSPITAL_BASED_OUTPATIENT_CLINIC_OR_DEPARTMENT_OTHER)
Admission: EM | Admit: 2015-04-21 | Discharge: 2015-04-21 | Disposition: A | Discharge status: Home / Self Care | Payer: Medicare Other | Attending: Emergency Medicine | Admitting: Emergency Medicine

## 2015-04-21 ENCOUNTER — Encounter (HOSPITAL_BASED_OUTPATIENT_CLINIC_OR_DEPARTMENT_OTHER): Payer: Self-pay | Admitting: *Deleted

## 2015-04-21 DIAGNOSIS — Z87440 Personal history of urinary (tract) infections: Secondary | ICD-10-CM | POA: Diagnosis not present

## 2015-04-21 DIAGNOSIS — Z7982 Long term (current) use of aspirin: Secondary | ICD-10-CM | POA: Insufficient documentation

## 2015-04-21 DIAGNOSIS — S0990XA Unspecified injury of head, initial encounter: Secondary | ICD-10-CM | POA: Insufficient documentation

## 2015-04-21 DIAGNOSIS — Y92511 Restaurant or cafe as the place of occurrence of the external cause: Secondary | ICD-10-CM | POA: Diagnosis not present

## 2015-04-21 DIAGNOSIS — S79922A Unspecified injury of left thigh, initial encounter: Secondary | ICD-10-CM | POA: Diagnosis not present

## 2015-04-21 DIAGNOSIS — Y999 Unspecified external cause status: Secondary | ICD-10-CM | POA: Insufficient documentation

## 2015-04-21 DIAGNOSIS — W010XXA Fall on same level from slipping, tripping and stumbling without subsequent striking against object, initial encounter: Secondary | ICD-10-CM | POA: Diagnosis not present

## 2015-04-21 DIAGNOSIS — Y9301 Activity, walking, marching and hiking: Secondary | ICD-10-CM | POA: Diagnosis not present

## 2015-04-21 DIAGNOSIS — S99912A Unspecified injury of left ankle, initial encounter: Secondary | ICD-10-CM | POA: Diagnosis not present

## 2015-04-21 DIAGNOSIS — I10 Essential (primary) hypertension: Secondary | ICD-10-CM | POA: Diagnosis not present

## 2015-04-21 DIAGNOSIS — Z792 Long term (current) use of antibiotics: Secondary | ICD-10-CM | POA: Insufficient documentation

## 2015-04-21 DIAGNOSIS — G8929 Other chronic pain: Secondary | ICD-10-CM | POA: Insufficient documentation

## 2015-04-21 DIAGNOSIS — K219 Gastro-esophageal reflux disease without esophagitis: Secondary | ICD-10-CM | POA: Diagnosis not present

## 2015-04-21 DIAGNOSIS — S99922A Unspecified injury of left foot, initial encounter: Secondary | ICD-10-CM | POA: Diagnosis present

## 2015-04-21 DIAGNOSIS — G309 Alzheimer's disease, unspecified: Secondary | ICD-10-CM | POA: Diagnosis not present

## 2015-04-21 DIAGNOSIS — Z79899 Other long term (current) drug therapy: Secondary | ICD-10-CM | POA: Diagnosis not present

## 2015-04-21 DIAGNOSIS — E079 Disorder of thyroid, unspecified: Secondary | ICD-10-CM | POA: Insufficient documentation

## 2015-04-21 DIAGNOSIS — W19XXXA Unspecified fall, initial encounter: Secondary | ICD-10-CM

## 2015-04-21 DIAGNOSIS — F419 Anxiety disorder, unspecified: Secondary | ICD-10-CM | POA: Diagnosis not present

## 2015-04-21 DIAGNOSIS — M1990 Unspecified osteoarthritis, unspecified site: Secondary | ICD-10-CM | POA: Diagnosis not present

## 2015-04-21 DIAGNOSIS — S92355A Nondisplaced fracture of fifth metatarsal bone, left foot, initial encounter for closed fracture: Secondary | ICD-10-CM | POA: Diagnosis not present

## 2015-04-21 DIAGNOSIS — S92302A Fracture of unspecified metatarsal bone(s), left foot, initial encounter for closed fracture: Secondary | ICD-10-CM

## 2015-04-21 MED ORDER — HYDROCODONE-ACETAMINOPHEN 5-325 MG PO TABS
1.0000 | ORAL_TABLET | Freq: Once | ORAL | Status: AC
  Administered 2015-04-21: 1 via ORAL
  Filled 2015-04-21: qty 1

## 2015-04-21 NOTE — ED Notes (Signed)
Pt was able to demonstrate removing and putting the cam walker back on.  She ambulated with no assistance with her walker to and from the bathroom with staff at her side.

## 2015-04-21 NOTE — ED Notes (Signed)
Pt fell on step at restaurant- c/o left ankle pain

## 2015-04-21 NOTE — Discharge Instructions (Signed)
Metatarsal Fracture, Undisplaced  A metatarsal fracture is a break in the bone(s) of the foot. These are the bones of the foot that connect your toes to the bones of the ankle.  DIAGNOSIS   The diagnoses of these fractures are usually made with X-rays. If there are problems in the forefoot and x-rays are normal a later bone scan will usually make the diagnosis.   TREATMENT AND HOME CARE INSTRUCTIONS  · Treatment may or may not include a cast or walking shoe. When casts are needed the use is usually for short periods of time so as not to slow down healing with muscle wasting (atrophy).  · Activities should be stopped until further advised by your caregiver.  · Wear shoes with adequate shock absorbing capabilities and stiff soles.  · Alternative exercise may be undertaken while waiting for healing. These may include bicycling and swimming, or as your caregiver suggests.  · It is important to keep all follow-up visits or specialty referrals. The failure to keep these appointments could result in improper bone healing and chronic pain or disability.  · Warning: Do not drive a car or operate a motor vehicle until your caregiver specifically tells you it is safe to do so.  IF YOU DO NOT HAVE A CAST OR SPLINT:  · You may walk on your injured foot as tolerated or advised.  · Do not put any weight on your injured foot for as long as directed by your caregiver. Slowly increase the amount of time you walk on the foot as the pain allows or as advised.  · Use crutches until you can bear weight without pain. A gradual increase in weight bearing may help.  · Apply ice to the injury for 15-20 minutes each hour while awake for the first 2 days. Put the ice in a plastic bag and place a towel between the bag of ice and your skin.  · Only take over-the-counter or prescription medicines for pain, discomfort, or fever as directed by your caregiver.  SEEK IMMEDIATE MEDICAL CARE IF:   · Your cast gets damaged or breaks.  · You have  continued severe pain or more swelling than you did before the cast was put on, or the pain is not controlled with medications.  · Your skin or nails below the injury turn blue or grey, or feel cold or numb.  · There is a bad smell, or new stains or pus-like (purulent) drainage coming from the cast.  MAKE SURE YOU:   · Understand these instructions.  · Will watch your condition.  · Will get help right away if you are not doing well or get worse.  Document Released: 08/22/2002 Document Revised: 02/22/2012 Document Reviewed: 07/13/2008  [employer]® Patient Information ©2015 [employer]. This information is not intended to replace advice given to you by your health care provider. Make sure you discuss any questions you have with your health care provider.

## 2015-04-21 NOTE — ED Notes (Signed)
Patient transported to X-ray 

## 2015-04-21 NOTE — ED Notes (Signed)
Patient transported to CT 

## 2015-04-21 NOTE — ED Notes (Signed)
Son at bedside states he will  Ensure that Mrs Brandy Curry will have assistance at home and transportation to doctors appointment and he stated that he would be responsible for her care.

## 2015-04-21 NOTE — ED Provider Notes (Signed)
CSN: [id number]     Arrival date & time 04/21/15  1907 History  This chart was scribed for Tilden Fossa, MD by Roxy Cedar, ED Scribe. This patient was seen in room MH11/MH11 and the patient's care was started at 7:48 PM.   Chief Complaint  Patient presents with  . Fall   Patient is a 81 y.o. female presenting with fall. The history is provided by the patient. No language interpreter was used.  Fall Associated symptoms include headaches.   HPI Comments: Brandy Curry is a 81 y.o. female with a PMHx of renal insufficiency, thyroid disease, arthritis, hypertension, anxiety, alzheimer disease, GERD, chronic back pain, UTI, appendectomy, abdominal hysterectomy and mastectomy, who presents to the Emergency Department complaining of left ankle pain that began when she tripped down a step and fell while walking out of a restaurant prior to arrival. She reports head impact to the back of her head.  She denies LOC. She currently denies any head or neck pain. She denies associated chest pain, abdominal pain. She reports associated soreness to upper left leg. She states that she can not bear any weight on left leg. Patient currently lives in an apartment on her own. Patient currently takes Aspirin. Patient was taking Coumadin for DVT and has been off of it for the past year. Patient is seen by Dr. Tyler Deis for pain management and [facility] medical.  Past Medical History  Diagnosis Date  . Thyroid disease   . Arthritis   . Hypertension   . Anxiety   . Alzheimer disease   . GERD (gastroesophageal reflux disease)   . Chronic back pain   . Recurrent UTI    Past Surgical History  Procedure Laterality Date  . Appendectomy    . Abdominal hysterectomy    . Mastectomy    . Breast surgery     Family History  Problem Relation Age of Onset  . Cancer Mother   . Cancer Father    History  Substance Use Topics  . Smoking status: Never Smoker   . Smokeless tobacco: Not on file  . Alcohol Use: No    OB History    No data available     Review of Systems  Musculoskeletal: Positive for myalgias and arthralgias. Negative for neck pain.  Neurological: Positive for headaches.  All other systems reviewed and are negative.  Allergies  Doxycycline hyclate; Erythromycin; Lincomycin hcl; Minocycline hcl; Morphine; Nitrofurantoin; and Sulfonamide derivatives  Home Medications   Prior to Admission medications   Medication Sig Start Date End Date Taking? Authorizing Provider  ferrous sulfate 325 (65 FE) MG tablet Take 325 mg by mouth daily with breakfast.   Yes Historical Provider, MD  aspirin 81 MG tablet Take 81 mg by mouth daily.      Historical Provider, MD  atenolol (TENORMIN) 50 MG tablet Take 50 mg by mouth daily.      Historical Provider, MD  azithromycin (ZITHROMAX Z-PAK) 250 MG tablet Take 2 tablets on day one, then 1 tablet daily on days 2 through 5 12/29/13   Lajean Manes, MD  cholecalciferol (VITAMIN D) 1000 UNITS tablet Take 1,000 Units by mouth daily.      Historical Provider, MD  cholestyramine Lanetta Inch) 4 G packet Take 4 g by mouth 3 (three) times daily with meals.    Historical Provider, MD  cholestyramine light (PREVALITE) 4 G packet Take 4 g by mouth 1 day or 1 dose.      Historical Provider, MD  clonazePAM (  KLONOPIN) 0.5 MG tablet Take 0.25 mg by mouth daily.      Historical Provider, MD  diclofenac sodium (VOLTAREN) 1 % GEL Apply topically.    Historical Provider, MD  escitalopram (LEXAPRO) 20 MG tablet Take 20 mg by mouth daily.      Historical Provider, MD  fentaNYL (DURAGESIC - DOSED MCG/HR) 25 MCG/HR Place 1 patch onto the skin every 3 (three) days.      Historical Provider, MD  FIBER PO Take by mouth.    Historical Provider, MD  fish oil-omega-3 fatty acids 1000 MG capsule Take 2 g by mouth daily.    Historical Provider, MD  furosemide (LASIX) 20 MG tablet Take 10 mg by mouth.    Historical Provider, MD  gabapentin (NEURONTIN) 100 MG capsule Take 100 mg by mouth 3  (three) times daily.    Historical Provider, MD  hydrocodone-acetaminophen (LORCET PLUS) 7.5-650 MG per tablet Take 1 tablet by mouth daily.      Historical Provider, MD  lansoprazole (PREVACID) 30 MG capsule Take 30 mg by mouth daily.      Historical Provider, MD  latanoprost (XALATAN) 0.005 % ophthalmic solution 1 drop at bedtime.      Historical Provider, MD  levothyroxine (SYNTHROID, LEVOTHROID) 112 MCG tablet Take 112 mcg by mouth daily.      Historical Provider, MD  losartan (COZAAR) 50 MG tablet Take 50 mg by mouth daily.    Historical Provider, MD  memantine (NAMENDA) 10 MG tablet Take 10 mg by mouth 2 (two) times daily.      Historical Provider, MD  nitroGLYCERIN (NITROSTAT) 0.4 MG SL tablet Place 0.4 mg under the tongue every 5 (five) minutes as needed for chest pain.    Historical Provider, MD  omeprazole (PRILOSEC) 20 MG capsule Take 20 mg by mouth daily.    Historical Provider, MD  polyethylene glycol (MIRALAX / GLYCOLAX) packet Take 17 g by mouth daily.      Historical Provider, MD  Probiotic Product (PROBIOTIC DAILY PO) Take by mouth.    Historical Provider, MD  rivastigmine (EXELON) 4.5 MG capsule Take 4.5 mg by mouth once.    Historical Provider, MD  warfarin (COUMADIN) 2 MG tablet Take 2 mg by mouth.      Historical Provider, MD  zolpidem (AMBIEN) 10 MG tablet Take 5 mg by mouth at bedtime as needed for sleep.    Historical Provider, MD   Triage Vitals: BP 180/77 mmHg  Pulse 74  Temp(Src) 98.3 F (36.8 C)  Resp 20  Ht 5\' 3"  (1.6 m)  Wt 120 lb (54.432 kg)  BMI 21.26 kg/m2  SpO2 97%  Physical Exam  Constitutional: She is oriented to person, place, and time. She appears well-developed and well-nourished.  HENT:  Head: Normocephalic and atraumatic.  Cardiovascular: Normal rate and regular rhythm.   No murmur heard. Pulmonary/Chest: Effort normal and breath sounds normal. No respiratory distress.  Abdominal: Soft. There is no tenderness. There is no rebound and no  guarding.  Musculoskeletal: She exhibits edema and tenderness.  Mild tenderness to posterior scalp on right. Left thigh tenderness. Mild swelling of left ankle with anterior ankle tenderness by palpation. 2+ DP pulses bilaterally.  Neurological: She is alert and oriented to person, place, and time.  Skin: Skin is warm and dry.  Psychiatric: She has a normal mood and affect. Her behavior is normal.  Nursing note and vitals reviewed.  ED Course  Procedures (including critical care time)  DIAGNOSTIC STUDIES: Oxygen Saturation  is 97% on RA, normal by my interpretation.    COORDINATION OF CARE: 7:55 PM- Discussed plans to order diagnostic imaging of head, neck, left ankle, left femur and left hip. Will give patient medication for pain management. Pt advised of plan for treatment and pt agrees.  Labs Review Labs Reviewed - No data to display  Imaging Review Dg Ankle Complete Left  04/21/2015   CLINICAL DATA:  Tripping injury with left ankle pain, initial encounter  EXAM: LEFT ANKLE COMPLETE - 3+ VIEW  COMPARISON:  None.  FINDINGS: Mild soft tissue swelling is noted about the ankle joint. There is an undisplaced fracture at the base of the fifth metatarsal. Vascular calcifications are noted. Small well corticated bony densities are noted adjacent to the fibular tip likely related to prior trauma.  IMPRESSION: Undisplaced fracture of the fifth metatarsal base.  No other focal abnormality is seen.   Electronically Signed   By: Alcide Clever M.D.   On: 04/21/2015 20:52   Ct Head Wo Contrast  04/21/2015   CLINICAL DATA:  Larey Seat, striking back of head.  EXAM: CT HEAD WITHOUT CONTRAST  CT CERVICAL SPINE WITHOUT CONTRAST  TECHNIQUE: Multidetector CT imaging of the head and cervical spine was performed following the standard protocol without intravenous contrast. Multiplanar CT image reconstructions of the cervical spine were also generated.  COMPARISON:  None.  FINDINGS: CT HEAD FINDINGS  There is no  intracranial hemorrhage, mass or evidence of acute infarction. There is moderate generalized atrophy. There is moderate chronic microvascular ischemic change. There is encephalomalacia due to remote right occipital infarction. There is no significant extra-axial fluid collection.  No acute intracranial findings are evident. The skull base and calvarium are intact.  CT CERVICAL SPINE FINDINGS  The vertebral column, pedicles and facet articulations are intact. There is no evidence of acute fracture. No acute soft tissue abnormalities are evident.  There are moderately severe degenerative disc changes at C6-7 and milder degenerative disc changes at C5-6. There is grade 1 degenerative -appearing anterolisthesis at C4-5. There is facet arthritis bilaterally, greatest at C 3 4 and C4-5.  IMPRESSION: *No evidence of acute intracranial traumatic injury. 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 There is moderate generalized atrophy and chronic microvascular ischemic disease. *Negative for acute cervical spine fracture. There are moderately severe degenerative disc and facet changes in the cervical spine.   Electronically Signed   By: Ellery Plunk M.D.   On: 04/21/2015 20:34   Ct Cervical Spine Wo Contrast  04/21/2015   CLINICAL DATA:  Larey Seat, striking back of head.  EXAM: CT HEAD WITHOUT CONTRAST  CT CERVICAL SPINE WITHOUT CONTRAST  TECHNIQUE: Multidetector CT imaging of the head and cervical spine was performed following the standard protocol without intravenous contrast. Multiplanar CT image reconstructions of the cervical spine were also generated.  COMPARISON:  None.  FINDINGS: CT HEAD FINDINGS  There is no intracranial hemorrhage, mass or evidence of acute infarction. There is moderate generalized atrophy. There is moderate chronic microvascular ischemic change. There is encephalomalacia due to remote right occipital infarction. There is no significant extra-axial fluid collection.  No acute intracranial  findings are evident. The skull base and calvarium are intact.  CT CERVICAL SPINE FINDINGS  The vertebral column, pedicles and facet articulations are intact. There is no evidence of acute fracture. No acute soft tissue abnormalities are evident.  There are moderately severe degenerative disc changes at C6-7 and milder degenerative disc changes at C5-6. There is grade 1 degenerative -appearing anterolisthesis  at C4-5. There is facet arthritis bilaterally, greatest at C 3 4 and C4-5.  IMPRESSION: *No evidence of acute intracranial traumatic injury. 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 There is moderate generalized atrophy and chronic microvascular ischemic disease. *Negative for acute cervical spine fracture. There are moderately severe degenerative disc and facet changes in the cervical spine.   Electronically Signed   By: Ellery Plunkaniel R Mitchell M.D.   On: 04/21/2015 20:34   Dg Foot Complete Left  04/21/2015   CLINICAL DATA:  Left foot pain after fall  EXAM: LEFT FOOT - COMPLETE 3+ VIEW  COMPARISON:  None.  FINDINGS: There is a nondisplaced well aligned transverse fracture of the fifth metatarsal base. No other fractures are evident. There is no radiopaque foreign body.  IMPRESSION: Fifth metatarsal base fracture   Electronically Signed   By: Ellery Plunkaniel R Mitchell M.D.   On: 04/21/2015 22:09   Dg Hip Unilat With Pelvis 2-3 Views Left  04/21/2015   CLINICAL DATA:  Tripping injury with left hip pain, initial encounter  EXAM: LEFT HIP (WITH PELVIS) 2-3 VIEWS  COMPARISON:  None.  FINDINGS: The pelvic ring is intact. Mild degenerative changes of the hip joints are noted bilaterally. No acute fracture or dislocation is seen. No gross soft tissue abnormality is noted.  IMPRESSION: No acute abnormality noted.   Electronically Signed   By: Alcide CleverMark  Lukens M.D.   On: 04/21/2015 20:50   Dg Femur Min 2 Views Left  04/21/2015   CLINICAL DATA:  Tripping injury with left leg pain  EXAM: LEFT FEMUR 2 VIEWS  COMPARISON:  None.   FINDINGS: There is no evidence of fracture or other focal bone lesions. Soft tissues are unremarkable.  IMPRESSION: No acute abnormality noted.   Electronically Signed   By: Alcide CleverMark  Lukens M.D.   On: 04/21/2015 20:52     EKG Interpretation None     MDM   Final diagnoses:  Fall  Fracture of fifth metatarsal bone, left, closed, initial encounter    Pt here for evaluation of injuries following a mechanical fall.  Has multiple tender areas - only apparent fracture is to base of fifth metatarsal.  D/w Dr. Roda ShuttersXu with ortho - ok to do Cam walker so pt can ambulate with a walker.  Pt able to ambulate in department without difficulty.  Plan to d/c home with outpatient ortho follow up.  No evidence of acute hip fracture based on repeat eval.    I personally performed the services described in this documentation, which was scribed in my presence. The recorded information has been reviewed and is accurate.   Tilden FossaElizabeth Carder Yin, MD 04/22/15 [phone]

## 2015-10-21 IMAGING — CT CT CERVICAL SPINE W/O CM
3 series · 13 of 35 positions shown, 16 images · non-contrast
Comparison: None.

CLINICAL DATA: Fell, striking back of head.

EXAM:
CT HEAD WITHOUT CONTRAST
CT CERVICAL SPINE WITHOUT CONTRAST
TECHNIQUE: Multidetector CT imaging of the head and cervical spine was
performed following the standard protocol without intravenous
contrast. Multiplanar CT image reconstructions of the cervical spine
were also generated.

[Series 3: c_spine 2.0 b41s st · axial · 0.35mm/px · z∈[-283,-185]mm · 5 of 71 slices shown, 7 images]
[im 11/71  soft-tissue]
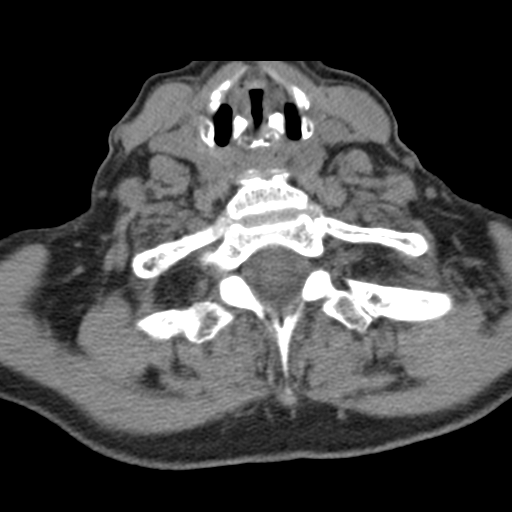
[im 11/71  bone]
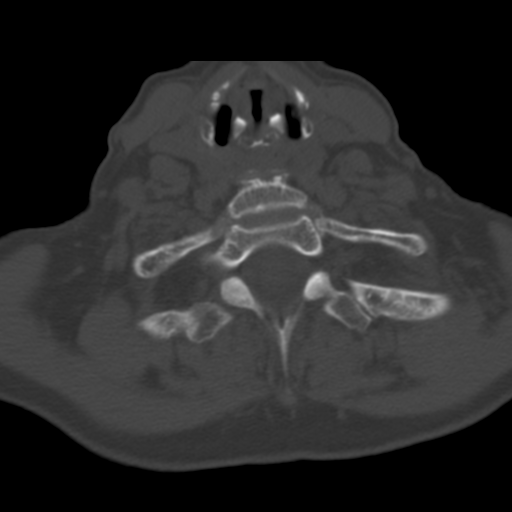
[im 22/71  bone]
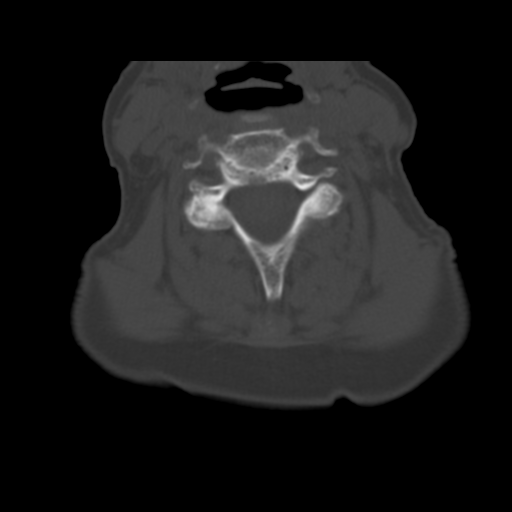
[im 38/71  bone]
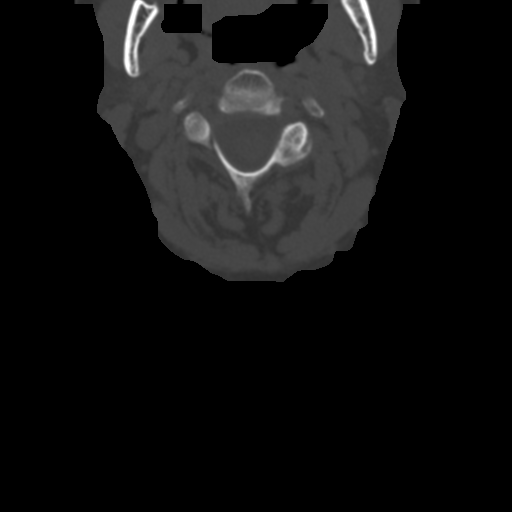
[im 49/71  bone]
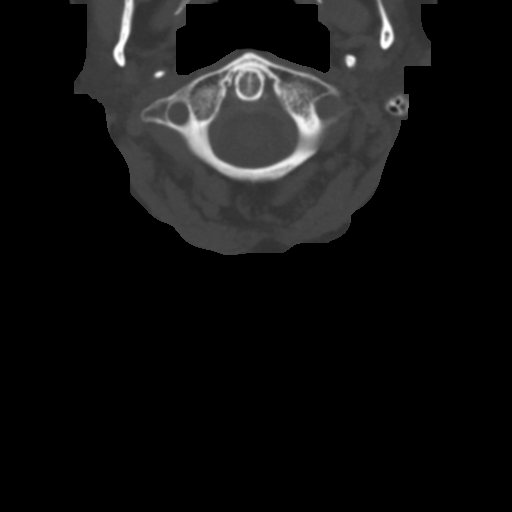
[im 60/71  soft-tissue]
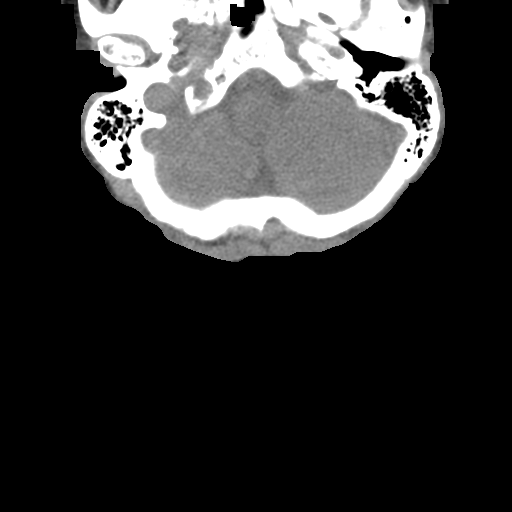
[im 60/71  bone]
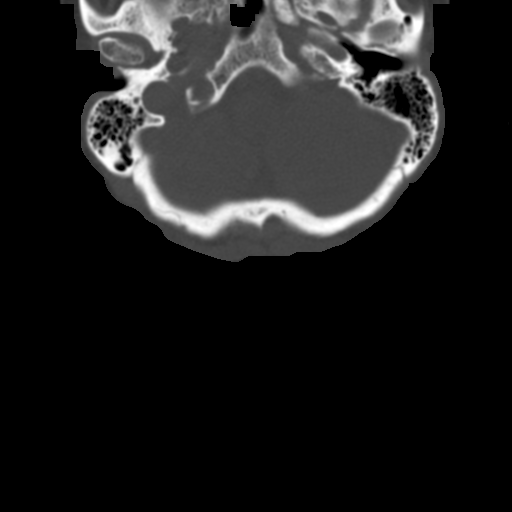

[Series 6: c_spine 2.0 coronal · coronal · 0.31mm/px · 3 of 59 slices shown]
[im 13/59  bone]
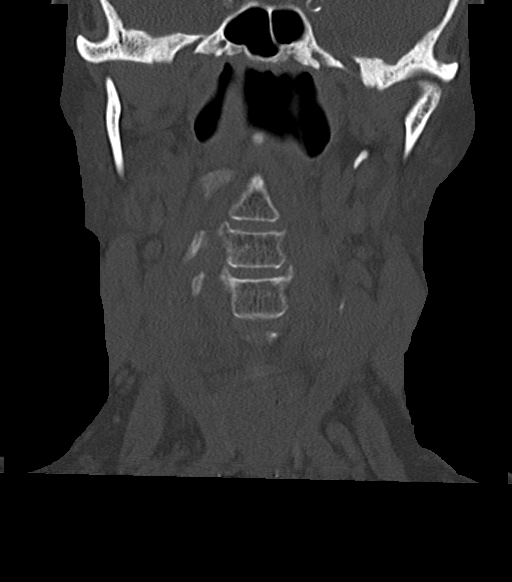
[im 24/59  bone]
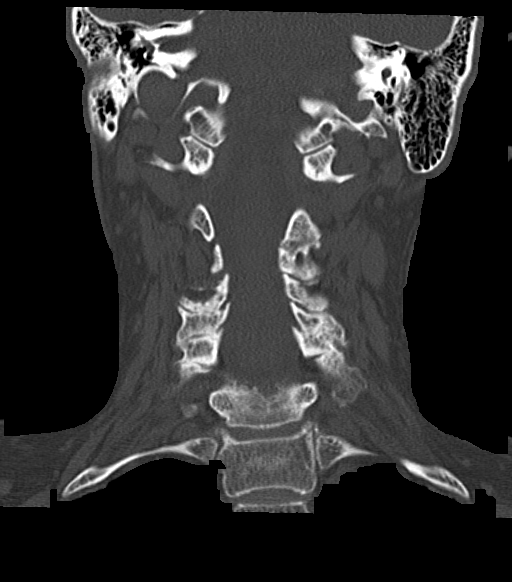
[im 35/59  bone]
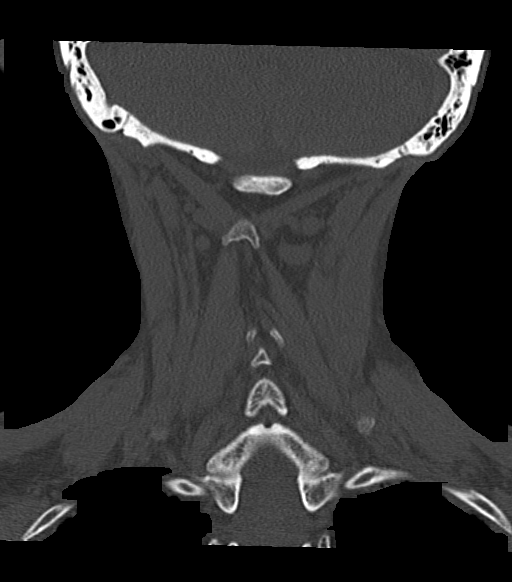

[Series 7: c_spine 2.0 sagittal · sagittal · 0.31mm/px · 5 of 48 slices shown, 6 images]
[im 16/48  bone]
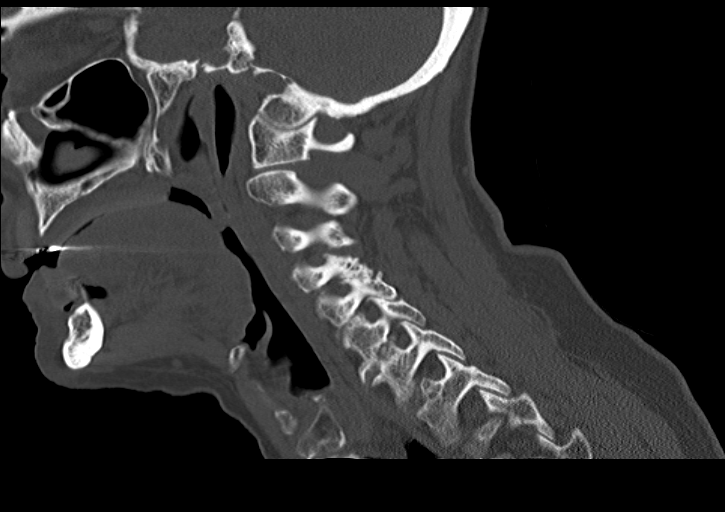
[im 20/48  bone]
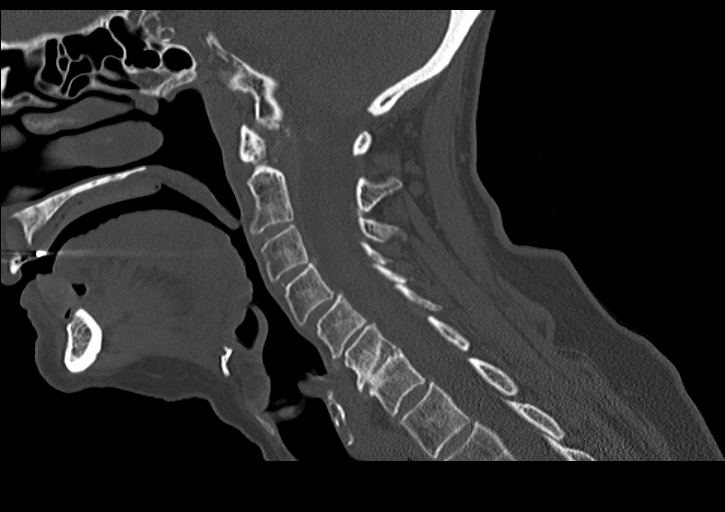
[im 24/48  soft-tissue]
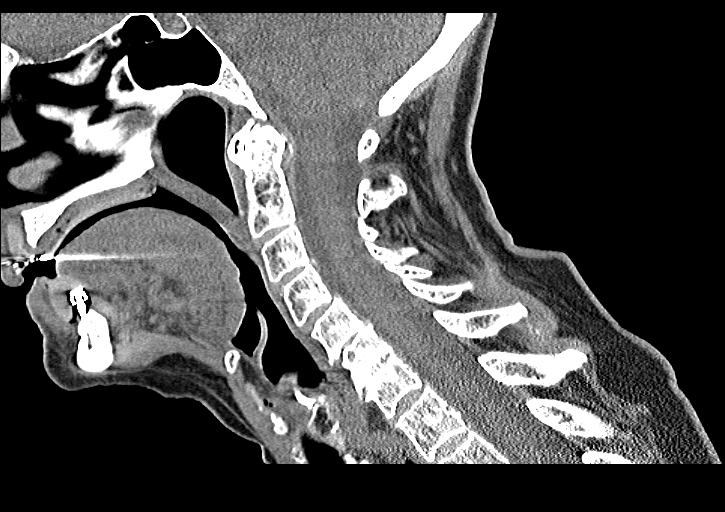
[im 24/48  bone]
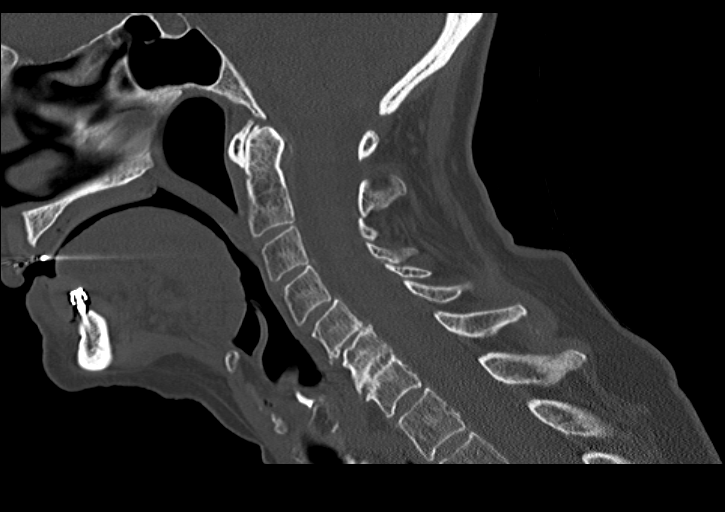
[im 28/48  bone]
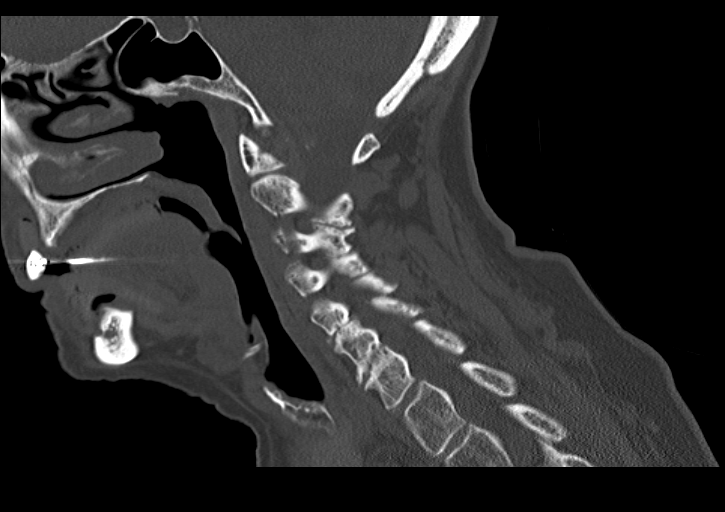
[im 32/48  bone]
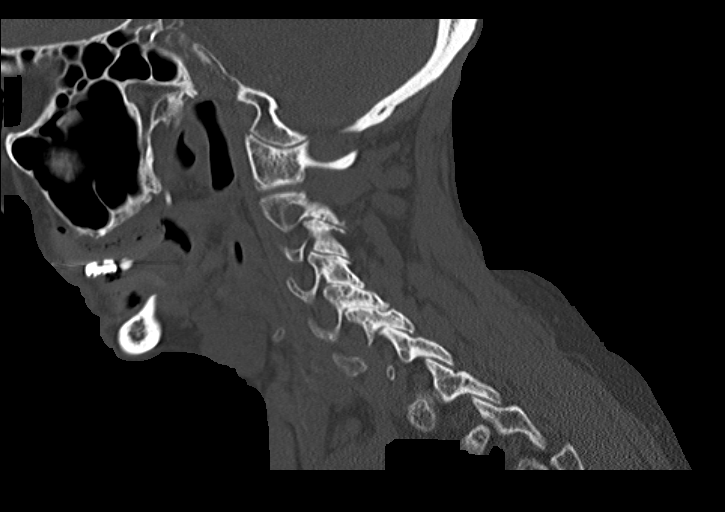

[13 of 35 positions shown; findings below may reference images not displayed]

FINDINGS: CT HEAD FINDINGS

There is no intracranial hemorrhage, mass or evidence of acute
infarction. There is moderate generalized atrophy. There is moderate
chronic microvascular ischemic change. There is encephalomalacia due
to remote right occipital infarction. There is no significant
extra-axial fluid collection.

No acute intracranial findings are evident. The skull base and
calvarium are intact.

CT CERVICAL SPINE FINDINGS

The vertebral column, pedicles and facet articulations are intact.
There is no evidence of acute fracture. No acute soft tissue
abnormalities are evident.

There are moderately severe degenerative disc changes at C6-7 and
milder degenerative disc changes at C5-6. There is grade 1
degenerative -appearing anterolisthesis at C4-5. There is facet
arthritis bilaterally, greatest at C 3 4 and C4-5.
IMPRESSION: *No evidence of acute intracranial traumatic injury.
*There is remote right occipital infarction. There is moderate
generalized atrophy and chronic microvascular ischemic disease.
*Negative for acute cervical spine fracture. There are moderately
severe degenerative disc and facet changes in the cervical spine.

## 2017-09-09 ENCOUNTER — Other Ambulatory Visit
Admission: RE | Admit: 2017-09-09 | Discharge: 2017-09-09 | Disposition: A | Discharge status: Home / Self Care | Payer: Medicare Other | Source: Ambulatory Visit | Attending: Urology | Admitting: Urology

## 2017-09-09 DIAGNOSIS — N390 Urinary tract infection, site not specified: Secondary | ICD-10-CM | POA: Insufficient documentation

## 2017-09-09 LAB — URINALYSIS, MACROSCOPIC
BILIRUBIN: NEGATIVE mg/dL
GLUCOSE: NEGATIVE mg/dL
KETONES: NEGATIVE mg/dL
NITRITE: NEGATIVE
PH: 6 (ref 5.0–8.0)
SPECIFIC GRAVITY: 1.02 (ref 1.002–1.030)
UROBILINOGEN: 0.2 mg/dL (ref <0.2)

## 2017-09-09 LAB — URINALYSIS, MICROSCOPIC

## 2017-09-15 LAB — URINE CULTURE,ROUTINE: URINE CULTURE: >100000 — AB

## 2017-10-07 ENCOUNTER — Other Ambulatory Visit
Admission: RE | Admit: 2017-10-07 | Discharge: 2017-10-07 | Disposition: A | Discharge status: Home / Self Care | Payer: Medicare Other | Source: Ambulatory Visit | Attending: Urology | Admitting: Urology

## 2017-10-07 DIAGNOSIS — N390 Urinary tract infection, site not specified: Secondary | ICD-10-CM | POA: Insufficient documentation

## 2017-10-07 LAB — URINALYSIS, MICROSCOPIC

## 2017-10-07 LAB — URINALYSIS, MACROSCOPIC AND MICROSCOPIC W/CULTURE REFLEX - CLIENT CONSOLIDATED
BILIRUBIN: NEGATIVE mg/dL
KETONES: NEGATIVE mg/dL
NITRITE: NEGATIVE
PROTEIN: NEGATIVE mg/dL
SPECIFIC GRAVITY: 1.02 (ref 1.002–1.030)
UROBILINOGEN: 0.2 mg/dL (ref <0.2)

## 2017-10-07 LAB — URINALYSIS, MACROSCOPIC
BILIRUBIN: NEGATIVE mg/dL
GLUCOSE: NEGATIVE mg/dL
KETONES: NEGATIVE mg/dL
PH: 5.5 (ref 5.0–8.0)

## 2017-10-10 LAB — URINE CULTURE,ROUTINE: URINE CULTURE: NO GROWTH

## 2017-11-08 DIAGNOSIS — M71551 Other bursitis, not elsewhere classified, right hip: Secondary | ICD-10-CM

## 2017-11-08 DIAGNOSIS — Z01818 Encounter for other preprocedural examination: Secondary | ICD-10-CM

## 2018-02-02 ENCOUNTER — Encounter (HOSPITAL_BASED_OUTPATIENT_CLINIC_OR_DEPARTMENT_OTHER): Payer: Self-pay | Admitting: Urology

## 2018-02-02 ENCOUNTER — Ambulatory Visit: Payer: Medicare Other | Attending: Urology | Admitting: Urology

## 2018-02-02 DIAGNOSIS — N390 Urinary tract infection, site not specified: Principal | ICD-10-CM | POA: Insufficient documentation

## 2018-02-02 DIAGNOSIS — F0390 Unspecified dementia without behavioral disturbance: Secondary | ICD-10-CM | POA: Insufficient documentation

## 2018-02-02 DIAGNOSIS — N3281 Overactive bladder: Secondary | ICD-10-CM | POA: Insufficient documentation

## 2018-02-02 MED ORDER — ESTRADIOL 0.01% (0.1 MG/GRAM) VAGINAL CREAM: 2 g | g | VAGINAL | 5 refills | 0 days | Status: AC

## 2018-02-02 MED ORDER — MIRABEGRON ER 50 MG TABLET,EXTENDED RELEASE 24 HR
50.0000 mg | ORAL_TABLET | Freq: Every day | ORAL | 2 refills | Status: AC
Start: 2018-02-02 — End: 2018-03-04

## 2018-02-02 NOTE — H&P (Signed)
Urology H&P Note     ICD-10-CM    1. Recurrent UTI N39.0    2. OAB (overactive bladder) N32.81      Reason for clinic visit: Recurrent UTIs and OAB   History of present illness: Tasha [name] is a 84 y.o. female who presents to our clinic for the first time.  Patient is referred for the aforementioned problems.  Over the last 6 months patient had at least 3 UTIs with highly resistant bacteria. Reports typical UTI symptoms. Had to be given home IV infusions which resulted in improvement. Currently feels better.  Also has OAB managed with fesoterodine 8 mg. Thinks it is helping her somewhat but still has to wear several pads per day. Patient has mild dementia.  Location: lower urinary tract  Severity: moderate  Duration: about 6 months  Associated symptoms: none currently    Past Medical History:   Diagnosis Date   . CVA (cerebrovascular accident) (CMS HCC)    . Diverticulitis    . Hypertension           Past Surgical History:   Procedure Laterality Date   . BREAST CYST EXCISION Bilateral    . EYE SURGERY     . HIP SURGERY     . HX CHOLECYSTECTOMY     . HX HYSTERECTOMY            Allergies   Allergen Reactions   . Furadantin [Nitrofurantoin]    . Morphine       No current outpatient medications on file.   Family Medical History:     None             Social History     Tobacco Use   . Smoking status: Never Smoker   . Smokeless tobacco: Never Used   Substance Use Topics   . Alcohol use: Never     Frequency: Never   . Drug use: Not on file      ROS: A comprehensive review of systems was performed. The patient denies fevers, chills, nausea, vomiting, decreased appetite, or unexpected weight loss. The rest of the review of systems was negative except as stated in the above HPI.   Physical Examination Findings:   There were no vitals taken for this visit.       1. Constitutional/General: No acute distress, alert and oriented.   2. Eyes:?EOMI and sclera anicteric   3. ENT: oropharynx clear; neck supple and no  lymphadenopathy   4. Resiratory: good inspiratory effort, clear bilaterally   5. Cardiac: regular rate and rhythm.   6. GI: abdomen soft, nontender, no organomegaly, no masses palpable and?nondistended   7. GU: No flank tenderness.   8. Neuro: alert and oriented x 3, no focal neurological deficits   9. Psychiatric: normal affect and mentation   10. Skin: warm, dry, clear; no rashes or lesions   Labs:   09/15/2017 8:17 AM - Clarise Cruzonrad, [place], [place]     Specimen Information: Urine, Site not specified       URINE CULTURE >100000 Escherichia coli (ESBL)Abnormal          This organism exhibits nonsusceptibility to 3rd generation cephalosporins, consistent with ESBL production.  Results duplicated. Extended Spectrum Beta-lactamase Positive Escherichia coli.            Susceptibility       Escherichia coli (ESBL) (1)     Antibiotic Interpretation MIC Method Status   Amikacin Sensitive <=8  Not Specified Final   Ampicillin  Resistant >16  Not Specified Final   Aztreonam Resistant >16  Not Specified Final   Cefazolin Resistant >16  Not Specified Final   Cefepime Resistant >16  Not Specified Final   Ceftazidime Resistant 16  Not Specified Final   Ceftriaxone Resistant >32  Not Specified Final   Ciprofloxacin Resistant >2  Not Specified Final   ESBL  Positive mcg/mL MIC SUSCEPTIBILITY Final   Gentamicin Resistant >8  Not Specified Final   Imipenem Sensitive <=0.25  Not Specified Final   Levofloxacin Resistant >4  Not Specified Final   Meropenem Sensitive <=0.25  Not Specified Final   Nitrofurantoin Sensitive <=16  Not Specified Final   Piperacillin/Tazobactam Resistant   Not Specified Final   Tetracycline Resistant >8  Not Specified Final   Tobramycin Resistant >8  Not Specified Final   Trimethoprim/Sulfamethoxazole Resistant >2/38  Not Specified Final   Ertapenem Sensitive <=0.25  Not Specified Final   Amoxicillin/clauvulanate Resistant >16/8  Not Specified Final     URINE CULTURE   Date Value Ref Range Status   10/07/2017 No  significant growth 48 hours.  Final     Imaging: No relevant imaging.   Assessment and plan:   Tasha [name] is a 84 y.o. female with recurrent UTIs caused by highly resistant bacteria and OAB. Of note, patient is unable to take nitrofurantoin which limits the preventive options further. Patient will be given estrogen cream. Rationale for this explained.   With regards to OAB - given the presence of dementia and apparently limited effect of fesoterodine we will give patient mirabegron. If the effect of this medication is inferior to that of fesoterodine patient will contact us and will be given fesoterodine again.   She will RTC in about 3 months.  ?   Submitted by: Otilio Connors MD, PhD - 02/02/2018 - 08:54

## 2018-02-03 ENCOUNTER — Inpatient Hospital Stay (HOSPITAL_COMMUNITY): Payer: Medicare Other | Admitting: Neurological Surgery

## 2018-02-03 ENCOUNTER — Encounter (HOSPITAL_COMMUNITY): Payer: Self-pay

## 2018-02-03 ENCOUNTER — Inpatient Hospital Stay
Admission: EM | Admit: 2018-02-03 | Discharge: 2018-02-09 | DRG: 083 | Disposition: A | Discharge status: Skilled Nursing Facility | Payer: Medicare Other | Attending: Internal Medicine | Admitting: Internal Medicine

## 2018-02-03 ENCOUNTER — Inpatient Hospital Stay (EMERGENCY_DEPARTMENT_HOSPITAL): Payer: Medicare Other

## 2018-02-03 ENCOUNTER — Emergency Department (EMERGENCY_DEPARTMENT_HOSPITAL): Payer: Medicare Other | Admitting: Radiology

## 2018-02-03 ENCOUNTER — Emergency Department (EMERGENCY_DEPARTMENT_HOSPITAL): Payer: Medicare Other

## 2018-02-03 DIAGNOSIS — I10 Essential (primary) hypertension: Secondary | ICD-10-CM

## 2018-02-03 DIAGNOSIS — N390 Urinary tract infection, site not specified: Secondary | ICD-10-CM

## 2018-02-03 DIAGNOSIS — S065X9A Traumatic subdural hemorrhage with loss of consciousness of unspecified duration, initial encounter: Principal | ICD-10-CM | POA: Diagnosis present

## 2018-02-03 DIAGNOSIS — S3993XA Unspecified injury of pelvis, initial encounter: Secondary | ICD-10-CM

## 2018-02-03 DIAGNOSIS — Z79891 Long term (current) use of opiate analgesic: Secondary | ICD-10-CM

## 2018-02-03 DIAGNOSIS — Z885 Allergy status to narcotic agent status: Secondary | ICD-10-CM

## 2018-02-03 DIAGNOSIS — Z66 Do not resuscitate: Secondary | ICD-10-CM | POA: Diagnosis present

## 2018-02-03 DIAGNOSIS — I609 Nontraumatic subarachnoid hemorrhage, unspecified: Secondary | ICD-10-CM

## 2018-02-03 DIAGNOSIS — Z90710 Acquired absence of both cervix and uterus: Secondary | ICD-10-CM

## 2018-02-03 DIAGNOSIS — Z1624 Resistance to multiple antibiotics: Secondary | ICD-10-CM | POA: Diagnosis present

## 2018-02-03 DIAGNOSIS — I2510 Atherosclerotic heart disease of native coronary artery without angina pectoris: Secondary | ICD-10-CM | POA: Diagnosis present

## 2018-02-03 DIAGNOSIS — Z79899 Other long term (current) drug therapy: Secondary | ICD-10-CM

## 2018-02-03 DIAGNOSIS — S29001A Unspecified injury of muscle and tendon of front wall of thorax, initial encounter: Secondary | ICD-10-CM

## 2018-02-03 DIAGNOSIS — T1490XA Injury, unspecified, initial encounter: Secondary | ICD-10-CM

## 2018-02-03 DIAGNOSIS — J986 Disorders of diaphragm: Secondary | ICD-10-CM

## 2018-02-03 DIAGNOSIS — B9620 Unspecified Escherichia coli [E. coli] as the cause of diseases classified elsewhere: Secondary | ICD-10-CM | POA: Diagnosis present

## 2018-02-03 DIAGNOSIS — J449 Chronic obstructive pulmonary disease, unspecified: Secondary | ICD-10-CM | POA: Diagnosis present

## 2018-02-03 DIAGNOSIS — S299XXA Unspecified injury of thorax, initial encounter: Secondary | ICD-10-CM

## 2018-02-03 DIAGNOSIS — Z888 Allergy status to other drugs, medicaments and biological substances status: Secondary | ICD-10-CM

## 2018-02-03 DIAGNOSIS — F0390 Unspecified dementia without behavioral disturbance: Secondary | ICD-10-CM | POA: Diagnosis present

## 2018-02-03 DIAGNOSIS — E039 Hypothyroidism, unspecified: Secondary | ICD-10-CM | POA: Diagnosis present

## 2018-02-03 DIAGNOSIS — R197 Diarrhea, unspecified: Secondary | ICD-10-CM | POA: Diagnosis present

## 2018-02-03 DIAGNOSIS — I4581 Long QT syndrome: Secondary | ICD-10-CM

## 2018-02-03 DIAGNOSIS — W19XXXA Unspecified fall, initial encounter: Secondary | ICD-10-CM

## 2018-02-03 DIAGNOSIS — Z1612 Extended spectrum beta lactamase (ESBL) resistance: Secondary | ICD-10-CM | POA: Diagnosis present

## 2018-02-03 DIAGNOSIS — M546 Pain in thoracic spine: Secondary | ICD-10-CM

## 2018-02-03 DIAGNOSIS — R05 Cough: Secondary | ICD-10-CM

## 2018-02-03 DIAGNOSIS — R000 Tachycardia, unspecified: Secondary | ICD-10-CM

## 2018-02-03 DIAGNOSIS — I6200 Nontraumatic subdural hemorrhage, unspecified: Secondary | ICD-10-CM

## 2018-02-03 DIAGNOSIS — S065X0A Traumatic subdural hemorrhage without loss of consciousness, initial encounter: Secondary | ICD-10-CM

## 2018-02-03 DIAGNOSIS — Z8673 Personal history of transient ischemic attack (TIA), and cerebral infarction without residual deficits: Secondary | ICD-10-CM

## 2018-02-03 DIAGNOSIS — Z9049 Acquired absence of other specified parts of digestive tract: Secondary | ICD-10-CM

## 2018-02-03 DIAGNOSIS — S066X9A Traumatic subarachnoid hemorrhage with loss of consciousness of unspecified duration, initial encounter: Secondary | ICD-10-CM | POA: Diagnosis present

## 2018-02-03 DIAGNOSIS — Z87440 Personal history of urinary (tract) infections: Secondary | ICD-10-CM

## 2018-02-03 DIAGNOSIS — Z79890 Hormone replacement therapy (postmenopausal): Secondary | ICD-10-CM

## 2018-02-03 DIAGNOSIS — R471 Dysarthria and anarthria: Secondary | ICD-10-CM | POA: Diagnosis present

## 2018-02-03 DIAGNOSIS — S065XAA Traumatic subdural hemorrhage with loss of consciousness status unknown, initial encounter: Secondary | ICD-10-CM

## 2018-02-03 DIAGNOSIS — N3281 Overactive bladder: Secondary | ICD-10-CM | POA: Diagnosis present

## 2018-02-03 DIAGNOSIS — S3991XA Unspecified injury of abdomen, initial encounter: Secondary | ICD-10-CM

## 2018-02-03 DIAGNOSIS — S0990XA Unspecified injury of head, initial encounter: Secondary | ICD-10-CM

## 2018-02-03 DIAGNOSIS — N179 Acute kidney failure, unspecified: Secondary | ICD-10-CM | POA: Diagnosis present

## 2018-02-03 DIAGNOSIS — S066X0A Traumatic subarachnoid hemorrhage without loss of consciousness, initial encounter: Secondary | ICD-10-CM

## 2018-02-03 DIAGNOSIS — Z7982 Long term (current) use of aspirin: Secondary | ICD-10-CM

## 2018-02-03 DIAGNOSIS — W010XXA Fall on same level from slipping, tripping and stumbling without subsequent striking against object, initial encounter: Secondary | ICD-10-CM | POA: Diagnosis present

## 2018-02-03 DIAGNOSIS — R55 Syncope and collapse: Secondary | ICD-10-CM | POA: Diagnosis present

## 2018-02-03 HISTORY — DX: Atherosclerotic heart disease of native coronary artery without angina pectoris: I25.10

## 2018-02-03 LAB — CBC WITH DIFF
BASOPHIL #: 0.04 x10ˆ3/uL (ref 0.00–0.20)
BASOPHIL %: 1 %
EOSINOPHIL #: 0.05 x10ˆ3/uL (ref 0.00–0.50)
EOSINOPHIL %: 1 %
HCT: 37.8 % (ref 33.5–45.2)
HGB: 12.3 g/dL (ref 11.2–15.2)
LYMPHOCYTE #: 0.61 x10ˆ3/uL — ABNORMAL LOW (ref 1.00–4.80)
LYMPHOCYTE %: 8 %
MCH: 29.9 pg (ref 27.4–33.0)
MCHC: 32.5 g/dL (ref 32.5–35.8)
MCV: 91.9 fL (ref 78.0–100.0)
MONOCYTE #: 0.67 10*3/uL (ref 0.30–1.00)
MONOCYTE %: 9 %
MPV: 10.7 fL (ref 7.5–11.5)
NEUTROPHIL #: 6.37 10*3/uL (ref 1.50–7.70)
NEUTROPHIL %: 82 %
PLATELETS: 196 x10ˆ3/uL (ref 140–450)
RBC: 4.12 x10ˆ6/uL (ref 3.63–4.92)
RDW: 14.2 % (ref 12.0–15.0)
WBC: 7.7 x10ˆ3/uL (ref 3.5–11.0)

## 2018-02-03 LAB — DRUG SCREEN, NO CONFIRMATION, URINE
AMPHETAMINES URINE: NEGATIVE
BARBITURATES URINE: NEGATIVE
BENZODIAZEPINES URINE: NEGATIVE
BUPRENORPHINE URINE: NEGATIVE
CANNABINOIDS URINE: NEGATIVE
COCAINE METABOLITES URINE: NEGATIVE
CREATININE RANDOM URINE: 41 mg/dL
ECSTASY/MDMA URINE: NEGATIVE
METHADONE URINE: NEGATIVE
OPIATES URINE (LOW CUTOFF): POSITIVE — AB
OXIDANT-ADULTERATION: NEGATIVE
OXYCODONE URINE: POSITIVE — AB
PH-ADULTERATION: 7.5 (ref 4.5–9.0)
SPECIFIC GRAVITY-ADULTERATION: 1.019 g/mL (ref 1.005–1.030)
SPECIFIC GRAVITY-ADULTERATION: 1.019 g/mL (ref 1.005–1.030)

## 2018-02-03 LAB — VENOUS BLOOD GAS/LACTATE/CO-OX/LYTES (NA/K/CA/CL/GLUC)
%FIO2 (VENOUS): 21 %
BASE EXCESS: 0.1 mmol/L (ref <=3.0)
BASE EXCESS: 0.1 mmol/L (ref <=3.0)
BICARBONATE (VENOUS): 24.4 mmol/L (ref 22.0–26.0)
CARBOXYHEMOGLOBIN: 1.4 % (ref 0.0–2.5)
CHLORIDE: 106 mmol/L (ref 96–111)
GLUCOSE: 93 mg/dL (ref 60–105)
HEMOGLOBIN: 12.6 g/dL (ref 12.0–18.0)
IONIZED CALCIUM: 1.18 mmol/L (ref 1.10–1.36)
LACTATE: 3 mmol/L — ABNORMAL HIGH (ref 0.0–1.3)
MET-HEMOGLOBIN: 1.3 % (ref 0.0–3.5)
O2CT: 12.8 % (ref 6.7–17.5)
OXYHEMOGLOBIN: 72.2 % (ref 40.0–80.0)
PCO2 (VENOUS): 39 mm/Hg — ABNORMAL LOW (ref 41.00–51.00)
PH (VENOUS): 7.41 (ref 7.31–7.41)
PO2 (VENOUS): 37 mm/Hg (ref 35.0–50.0)
SODIUM: 135 mmol/L — ABNORMAL LOW (ref 136–145)
WHOLE BLOOD POTASSIUM: 4.5 mmol/L (ref 3.5–5.0)

## 2018-02-03 LAB — URINALYSIS, MACROSCOPIC
BILIRUBIN: NEGATIVE mg/dL
COLOR: NORMAL
GLUCOSE: NEGATIVE mg/dL
KETONES: NEGATIVE mg/dL
NITRITE: POSITIVE — AB
PH: 8 (ref 5.0–8.0)
PROTEIN: NEGATIVE mg/dL
SPECIFIC GRAVITY: 1.013 (ref 1.005–1.030)
UROBILINOGEN: NEGATIVE mg/dL

## 2018-02-03 LAB — PT/INR
INR: 0.99 (ref 0.80–1.20)
PROTHROMBIN TIME: 11.7 seconds (ref 9.5–14.1)

## 2018-02-03 LAB — CREATININE WITH EGFR
CREATININE: 1.35 mg/dL — ABNORMAL HIGH (ref 0.49–1.10)
ESTIMATED GFR: 37 mL/min/1.73mˆ2 — ABNORMAL LOW (ref >59)

## 2018-02-03 LAB — URINALYSIS, MICROSCOPIC
RBCS: 5 /hpf (ref <6.0)
WBCS: 18 /hpf — ABNORMAL HIGH (ref <11.0)

## 2018-02-03 LAB — ETHANOL, SERUM
ETHANOL: <10 mg/dL (ref <10)
ETHANOL: NOT DETECTED

## 2018-02-03 LAB — PTT (PARTIAL THROMBOPLASTIN TIME): APTT: 28.4 seconds (ref 24.1–38.5)

## 2018-02-03 LAB — BUN: BUN: 28 mg/dL — ABNORMAL HIGH (ref 8–25)

## 2018-02-03 MED ORDER — ATENOLOL 50 MG TABLET
50.0000 mg | ORAL_TABLET | Freq: Every day | ORAL | Status: DC
Start: 2018-02-03 — End: 2018-02-09
  Administered 2018-02-03: 0 mg via ORAL
  Administered 2018-02-04 – 2018-02-09 (×6): 50 mg via ORAL
  Filled 2018-02-03 (×7): qty 1

## 2018-02-03 MED ORDER — HYDROCODONE 5 MG-ACETAMINOPHEN 325 MG TABLET
2.0000 | ORAL_TABLET | ORAL | Status: DC | PRN
Start: 2018-02-03 — End: 2018-02-03

## 2018-02-03 MED ORDER — SODIUM CHLORIDE 0.9 % IV BOLUS
40.00 mL | INJECTION | Freq: Once | Status: AC | PRN
Start: 2018-02-03 — End: 2018-02-03

## 2018-02-03 MED ORDER — MIRTAZAPINE 15 MG TABLET
30.00 mg | ORAL_TABLET | Freq: Every evening | ORAL | Status: DC
Start: 2018-02-03 — End: 2018-02-09
  Administered 2018-02-03 – 2018-02-08 (×6): 30 mg via ORAL
  Filled 2018-02-03 (×7): qty 2

## 2018-02-03 MED ORDER — LEVOTHYROXINE 125 MCG TABLET
125.00 ug | ORAL_TABLET | Freq: Every morning | ORAL | Status: DC
Start: 2018-02-03 — End: 2018-02-09
  Administered 2018-02-03: 0 ug via ORAL
  Administered 2018-02-06 – 2018-02-09 (×3): 125 ug via ORAL
  Filled 2018-02-03 (×8): qty 1

## 2018-02-03 MED ORDER — LEVETIRACETAM 500 MG/100 ML IN SODIUM CHLORIDE (ISO-OSM) IV PIGGYBACK
500.0000 mg | INJECTION | Freq: Two times a day (BID) | INTRAVENOUS | Status: DC
Start: 2018-02-03 — End: 2018-02-03

## 2018-02-03 MED ORDER — LABETALOL 20 MG/4 ML (5 MG/ML) INTRAVENOUS SYRINGE
5.00 mg | INJECTION | INTRAVENOUS | Status: DC | PRN
Start: 2018-02-03 — End: 2018-02-04
  Administered 2018-02-04 (×3): 5 mg via INTRAVENOUS
  Filled 2018-02-03: qty 4

## 2018-02-03 MED ORDER — ACETAMINOPHEN 325 MG TABLET
650.0000 mg | ORAL_TABLET | ORAL | Status: DC | PRN
Start: 2018-02-03 — End: 2018-02-06
  Administered 2018-02-03 – 2018-02-06 (×7): 650 mg via ORAL
  Filled 2018-02-03 (×7): qty 2

## 2018-02-03 MED ORDER — HYDROCODONE 5 MG-ACETAMINOPHEN 325 MG TABLET
1.0000 | ORAL_TABLET | ORAL | Status: DC | PRN
Start: 2018-02-03 — End: 2018-02-03

## 2018-02-03 MED ORDER — SODIUM CHLORIDE 0.9 % (FLUSH) INJECTION SYRINGE
2.0000 mL | INJECTION | Freq: Three times a day (TID) | INTRAMUSCULAR | Status: DC
Start: 2018-02-03 — End: 2018-02-09
  Administered 2018-02-03 (×2): 0 mL
  Administered 2018-02-04: 2 mL
  Administered 2018-02-04 – 2018-02-06 (×5): 0 mL
  Administered 2018-02-06 – 2018-02-09 (×8): 2 mL

## 2018-02-03 MED ORDER — DIPHTH,PERTUSSIS(ACELL),TETANUS 2.5 LF UNIT-8 MCG-5 LF/0.5 ML IM SUSP
0.5000 mL | Freq: Once | INTRAMUSCULAR | Status: AC
Start: 2018-02-03 — End: 2018-02-03
  Administered 2018-02-03: 0.5 mL via INTRAMUSCULAR

## 2018-02-03 MED ORDER — DEXTROSE 5 % IN WATER (D5W) INTRAVENOUS SOLUTION
1.0000 g | INTRAVENOUS | Status: DC
Start: 2018-02-03 — End: 2018-02-03

## 2018-02-03 MED ORDER — LABETALOL 20 MG/4 ML (5 MG/ML) INTRAVENOUS SYRINGE
5.0000 mg | INJECTION | INTRAVENOUS | Status: DC | PRN
Start: 2018-02-03 — End: 2018-02-03
  Administered 2018-02-03: 5 mg via INTRAVENOUS
  Filled 2018-02-03: qty 4

## 2018-02-03 MED ORDER — ONDANSETRON HCL (PF) 4 MG/2 ML INJECTION SOLUTION
4.0000 mg | Freq: Four times a day (QID) | INTRAMUSCULAR | Status: DC | PRN
Start: 2018-02-03 — End: 2018-02-09
  Administered 2018-02-04 – 2018-02-06 (×2): 4 mg via INTRAVENOUS
  Filled 2018-02-03 (×2): qty 2

## 2018-02-03 MED ORDER — TRAVOPROST 0.004 % EYE DROPS
1.00 [drp] | Freq: Every evening | OPHTHALMIC | Status: DC
Start: 2018-02-03 — End: 2018-02-09
  Administered 2018-02-03 – 2018-02-07 (×5): 1 [drp] via OPHTHALMIC
  Filled 2018-02-03: qty 2.5

## 2018-02-03 MED ORDER — ACETAMINOPHEN 325 MG TABLET
650.0000 mg | ORAL_TABLET | ORAL | Status: DC | PRN
Start: 2018-02-03 — End: 2018-02-03

## 2018-02-03 MED ORDER — SODIUM CHLORIDE 0.9 % (FLUSH) INJECTION SYRINGE
2.0000 mL | INJECTION | INTRAMUSCULAR | Status: DC | PRN
Start: 2018-02-03 — End: 2018-02-09

## 2018-02-03 MED ORDER — CHOLESTYRAMINE-ASPARTAME 4 GRAM ORAL POWDER FOR SUSP IN A PACKET
4.00 g | Freq: Every evening | ORAL | Status: DC
Start: 2018-02-03 — End: 2018-02-07
  Administered 2018-02-03 – 2018-02-05 (×3): 0 g via ORAL
  Administered 2018-02-06: 4 g via ORAL
  Filled 2018-02-03 (×5): qty 1

## 2018-02-03 MED ORDER — MEMANTINE 10 MG TABLET
10.00 mg | ORAL_TABLET | Freq: Two times a day (BID) | ORAL | Status: DC
Start: 2018-02-03 — End: 2018-02-09
  Administered 2018-02-03 – 2018-02-09 (×9): 10 mg via ORAL
  Filled 2018-02-03 (×13): qty 1

## 2018-02-03 MED ORDER — LEVETIRACETAM 250 MG TABLET
250.0000 mg | ORAL_TABLET | Freq: Two times a day (BID) | ORAL | Status: DC
Start: 2018-02-03 — End: 2018-02-09
  Administered 2018-02-03 – 2018-02-08 (×10): 250 mg via ORAL
  Filled 2018-02-03 (×13): qty 1

## 2018-02-03 MED ORDER — HYDRALAZINE 20 MG/ML INJECTION SOLUTION
10.0000 mg | INTRAMUSCULAR | Status: DC | PRN
Start: 2018-02-03 — End: 2018-02-03

## 2018-02-03 MED ORDER — ERTAPENEM 1 GRAM SOLUTION FOR INJECTION
1000.00 mg | INTRAMUSCULAR | Status: AC
Start: 2018-02-03 — End: 2018-02-03
  Administered 2018-02-03: 1000 mg via INTRAVENOUS
  Administered 2018-02-03: 0 mg via INTRAVENOUS
  Filled 2018-02-03: qty 10

## 2018-02-03 MED ORDER — SENNOSIDES 8.6 MG-DOCUSATE SODIUM 50 MG TABLET
1.0000 | ORAL_TABLET | Freq: Every day | ORAL | Status: DC
Start: 2018-02-03 — End: 2018-02-07
  Administered 2018-02-03 – 2018-02-04 (×2): 0 via ORAL
  Administered 2018-02-05 – 2018-02-06 (×2): 1 via ORAL
  Filled 2018-02-03 (×3): qty 1

## 2018-02-03 MED ORDER — SODIUM CHLORIDE 0.9 % INTRAVENOUS SOLUTION
INTRAVENOUS | Status: DC
Start: 2018-02-03 — End: 2018-02-03

## 2018-02-03 MED ORDER — DOCUSATE SODIUM 100 MG CAPSULE
100.0000 mg | ORAL_CAPSULE | Freq: Two times a day (BID) | ORAL | Status: DC
Start: 2018-02-03 — End: 2018-02-06
  Administered 2018-02-03: 100 mg via ORAL
  Administered 2018-02-04: 0 mg via ORAL
  Administered 2018-02-04 – 2018-02-06 (×4): 100 mg via ORAL
  Filled 2018-02-03 (×5): qty 1

## 2018-02-03 MED ORDER — HYDRALAZINE 20 MG/ML INJECTION SOLUTION
10.00 mg | INTRAMUSCULAR | Status: DC | PRN
Start: 2018-02-03 — End: 2018-02-04

## 2018-02-03 MED ADMIN — sodium chloride 0.9 % intravenous solution: ORAL | @ 20:00:00 | NDC 00338004904

## 2018-02-03 NOTE — H&P (Signed)
[place]  Trauma History & Physical      Identification:  Name: Tasha [name]  Age and Gender: 84 y.o. female  Date of Birth: June 18, 1928  Date of Admission: 02/03/2018  MRN: [id number]  PCP: Tasha Rising, MD    Code Status: TBD, patient deferring to daughter. Attempted to call but no answer    Attending Physician: Hilda Lias, MD    HPI:   Trauma Level Alert: P2  Time of Trauma Page: 2440  NUUVOZD Time: 6644  [id number] via: Ambulance  Arriving from: [place]    Pre-Hospital Report:  Time of Injury: ~0700  Patient Condition: Stable  GCS: E3=To Voice (Opens Eyes When Asked in Loud Voice) M6=Normal (Follows Simple Commands) V4=Disoriented Conversation    Intubated: No   Fluids Received in Route: No fluids recieved  C-Collar: No  Back Board: No  Loss of Consciousness: Amnestic to event    Mechanism of Injury:   Fall    Arrival to [place]:  Information Obtained from: health care provider and history reviewed via medical record  Chief Complaint: right hip pain  Additional HPI Details: Tasha [name] is a 84 y.o. Unknown female s/p fall from standing, witness by family. No loss of consciousness but is amnestic to events. Went to [place] where CT brain showed R subdural. Was transferred to [facility].  On evaluation here she has a mild headache and complains of right hip pain. She is confused and cannot recall the events leading up to her hospitalization.  She is unsure of where she is or what happened today.  Per report, has history of HTN, dementia, COPD.  Attempted to Call family member but no answer.   Additional workup at [facility] was positive for UTI. Recently saw outside provider for recurrent UTI's and overactive bladder.  Multiple UTI's in the last several months with highly resistant organisms.     ROS:  MUST comment on all "Abnormal" findings   ROS Review of systems was not obtained due to poor mental status .    PAST MEDICAL/ FAMILY/ SOCIAL HISTORY:     Past Medical History  Current  Outpatient Medications   Medication Sig   . aspirin (ECOTRIN) 81 mg Oral Tablet, Delayed Release (E.C.) Take 81 mg by mouth Once a day   . atenolol (TENORMIN) 50 mg Oral Tablet Take 50 mg by mouth Once a day   . calcium carbonate 500 mg calcium (1,250 mg) Oral Tablet Take 500 mg by mouth Once a day   . Cholecalciferol, Vitamin D3, (VITAMIN D-3) 5,000 unit Oral Tablet Take by mouth Every 7 days   . cholestyramine-aspartame (PREVALITE) 4 gram Oral Powder in Packet Take 4 g by mouth Every evening with dinner   . estradiol (ESTRACE) 0.01 % (0.1 mg/gram) Vaginal Cream 2 g by Vaginal route Every Monday, Wednesday and Friday for 90 days   . gabapentin (NEURONTIN) 100 mg Oral Capsule Take 100 mg by mouth   . L.acid/L.casei/B.bif/B.lon/FOS (PROBIOTIC BLEND ORAL) Take by mouth   . latanoprost (XALATAN) 0.005 % Ophthalmic Drops Instill 1 Drop into both eyes Every evening   . levothyroxine (SYNTHROID) 125 mcg Oral Tablet Take 125 mcg by mouth Every morning   . magnesium oxide (MAG-OX) 400 mg (241.3 mg magnesium) Oral Tablet Take 400 mg by mouth Twice daily   . memantine (NAMENDA) 10 mg Oral Tablet Take 10 mg by mouth Twice daily   . mirabegron (MYRBETRIQ) 50 mg Oral Tablet Sustained  Release 24 hr Take 1 Tab (50 mg total) by mouth Once a day for 30 days   . mirtazapine (REMERON) 30 mg Oral Tablet Take 30 mg by mouth Every night   . oxyCODONE-acetaminophen (PERCOCET) 7.5-325 mg Oral Tablet Take 1 Tab by mouth Every 4 hours as needed for Pain     Allergies   Allergen Reactions   . Furadantin [Nitrofurantoin]    . Morphine      Past Medical History:   Diagnosis Date   . CVA (cerebrovascular accident) (CMS [place])    . Diverticulitis    . Hypertension          Past Surgical History:   Procedure Laterality Date   . BREAST CYST EXCISION Bilateral    . EYE SURGERY     . HIP SURGERY     . HX CHOLECYSTECTOMY     . HX HYSTERECTOMY           Family Medical History:     None            Social History     Socioeconomic History   . Marital  status: Unknown     Spouse name: Not on file   . Number of children: Not on file   . Years of education: Not on file   . Highest education level: Not on file   Social Needs   . Financial resource strain: Not on file   . Food insecurity - worry: Not on file   . Food insecurity - inability: Not on file   . Transportation needs - medical: Not on file   . Transportation needs - non-medical: Not on file   Occupational History   . Not on file   Tobacco Use   . Smoking status: Never Smoker   . Smokeless tobacco: Never Used   Substance and Sexual Activity   . Alcohol use: Never     Frequency: Never   . Drug use: Not on file   . Sexual activity: Not on file   Other Topics Concern   . Not on file   Social History Narrative   . Not on file       Tetanus: Given in ED    PHYSICAL EXAMINATION: MUST comment on all "Abnormal" findings    Initial Vitals:   ED Triage Vitals   Enc Vitals Group      BP (Non-Invasive) 02/03/18 1408 (!) 148/78      Heart Rate 02/03/18 1408 (!) 109      Respiratory Rate 02/03/18 1408 18      Temperature 02/03/18 1408 38 C (100.4 F)      Temp src --       SpO2-1 02/03/18 1408 93 %      Weight 02/03/18 1412 62.1 kg (136 lb 14.4 oz)      Height --       Head Circumference --       Peak Flow --       Pain Score --       Pain Loc --       Pain Edu? --       Excl. in [place]? --      GEN: elderly, frail appearing  HEENT: Normocephalic; atraumatic pupils equal, round and reactive to light; extraocular movements are intact.  Conjunctivae pink, nasal mucosa normal, mucous membranes moist.  No malocclusion.    NECK: TTP midline C-spine, C-collar placed  PULM: coarse breath sounds bilaterally  CV: Regular  rate and rhythm; S1/S2; no murmur, rub, or gallop.  Chest:: External examination non-tender to palpation. and No abrasions or contusions  ABD: Abdomen soft, nontender, and nondistended.  Pelvis: Non-tender to compression /palpation and +TTP over R greater trochanter  GU/RECTAL: deferred  Back: No evidence of injury,  no step-off and +TTP mid Thoracic spine  MS: Atraumatic.  Distal pulses intact.  Normal strength and range of motion of all extremities.    NEURO: somnolent but arousable.  oriented to person but not place or time.   Vascular: All pulses palpable and equal bilaterally  Integumentary: Pink, warm, and dry  PSYCHOSOCIAL: somnolent but arousable.       DIAGNOSTIC STUDIES: Comment on "Positives"   Labs:  Lab Results Today:    Results for orders placed or performed during the hospital encounter of 02/03/18 (from the past 24 hour(s))   PT/INR   Result Value Ref Range    PROTHROMBIN TIME 11.7 9.5 - 14.1 seconds    INR 0.99 0.80 - 1.20   PTT (PARTIAL THROMBOPLASTIN TIME)   Result Value Ref Range    APTT 28.4 24.1 - 38.5 seconds   BUN   Result Value Ref Range    BUN 28 (H) 8 - 25 mg/dL   CREATININE   Result Value Ref Range    CREATININE 1.35 (H) 0.49 - 1.10 mg/dL    ESTIMATED GFR 37 (L) >59 mL/min/1.37m2   ETHANOL, SERUM   Result Value Ref Range    ETHANOL <10 <10 mg/dL    ETHANOL None Detected    TYPE AND SCREEN   Result Value Ref Range    UNITS ORDERED NOT STATED         ABO/RH(D) A POSITIVE     ANTIBODY SCREEN PENDING     SPECIMEN EXPIRATION DATE 02/06/2018    VENOUS BLOOD GAS/COOX/LYTES/LAC   Result Value Ref Range    %FIO2 (VENOUS) 21.0 %    PH (VENOUS) 7.41 7.31 - 7.41    PCO2 (VENOUS) 39.00 (L) 41.00 - 51.00 mm/Hg    PO2 (VENOUS) 37.0 35.0 - 50.0 mm/Hg    BASE EXCESS 0.1 -3.0 - 3.0 mmol/L    BICARBONATE (VENOUS) 24.4 22.0 - 26.0 mmol/L    SODIUM 135 (L) 136 - 145 mmol/L    WHOLE BLOOD POTASSIUM 4.5 3.5 - 5.0 mmol/L    CHLORIDE 106 96 - 111 mmol/L    IONIZED CALCIUM 1.18 1.10 - 1.36 mmol/L    GLUCOSE 93 60 - 105 mg/dL    LACTATE 3.0 (H) 0.0 - 1.3 mmol/L    HEMOGLOBIN 12.6 12.0 - 18.0 g/dL    OXYHEMOGLOBIN 72.2 40.0 - 80.0 %    CARBOXYHEMOGLOBIN 1.4 0.0 - 2.5 %    MET-HEMOGLOBIN 1.3 0.0 - 3.5 %    O2CT 12.8 6.7 - 17.5 %   CBC WITH DIFF   Result Value Ref Range    WBC 7.7 3.5 - 11.0 x10^3/uL    RBC 4.12 3.63 - 4.92  x10^6/uL    HGB 12.3 11.2 - 15.2 g/dL    HCT 37.8 33.5 - 45.2 %    MCV 91.9 78.0 - 100.0 fL    MCH 29.9 27.4 - 33.0 pg    MCHC 32.5 32.5 - 35.8 g/dL    RDW 14.2 12.0 - 15.0 %    PLATELETS 196 140 - 450 x10^3/uL    MPV 10.7 7.5 - 11.5 fL    NEUTROPHIL % 82 %    LYMPHOCYTE % 8 %  MONOCYTE % 9 %    EOSINOPHIL % 1 %    BASOPHIL % 1 %    NEUTROPHIL # 6.37 1.50 - 7.70 x10^3/uL    LYMPHOCYTE # 0.61 (L) 1.00 - 4.80 x10^3/uL    MONOCYTE # 0.67 0.30 - 1.00 x10^3/uL    EOSINOPHIL # 0.05 0.00 - 0.50 x10^3/uL    BASOPHIL # 0.04 0.00 - 0.20 x10^3/uL   PRODUCT: PLATELETS - UNITS NON IRRADIATED, 1 Units   Result Value Ref Range    Coding System [id number]     UNIT NUMBER [id number]     BLOOD COMPONENT TYPE LR pheresis 12750     UNIT DIVISION 00     UNIT DISPENSE STATUS ALLOCATED     TRANSFUSION STATUS OK TO TRANSFUSE     Product Code [id number]        Radiology:    [facility] with reads:  CT Brain  Positive R subdural hematoma  CT Cervical Spine  Negative for trauma    Internal Studies:  AP Chest X-Ray  Negative for trauma  AP Pelvis X-Ray  Negative for trauma  FAST examination  Negative for trauma  CT Brain  Positive R subdural hematoma  CT Chest, Abdomen & Pelvis  Negative for trauma    Incidental findings: No      Consults Ordered   Neurosurgery    Assessment & Plan/Recommendations:      Patient Active Problem List   Diagnosis   . Subdural hematoma (CMS HCC)       Plan: Admission to Neurosurgery with TES consult     84 year old female s/p fall with subdural hematoma  -no other traumatic injuries requiring trauma surgery at this time  -will follow for tertiary exam  -management of SDH per neurosurgery. Platelets and keppra started in ER.   -started ertapenem for UTI at [facility] with multi drug resistant organism. Repeat UA pending      Mallie Darting, MD 02/03/2018       Late entry for 02/03/18. I saw and examined the patient.  I reviewed the resident's note.  I agree with the findings and plan of care as documented in the resident's note.   Any exceptions/additions are edited/noted.  Ground level fall.  SDH.  On anti-plt therapy.  Given plt.  Admit to ICU, neuro checks.    Hilda Lias, MD    Emergency Medicine, PGY-1  [facility]  Pager [phone]

## 2018-02-03 NOTE — H&P (Signed)
[facility]                              Neurocritical Care [facility](N[facility]) ADMISSION        HISTORY and PHYSICAL                    Tasha [name], Tasha [name], 84 y.o. female  Date of Admission:  02/03/2018  Date of Birth:  04/23/1928    PCP: Rolm BookbinderJulie D Hare, MD  Consult Requested By: Collier FlowersNSGY    Information Obtained from: patient and history reviewed via medical record  Chief Complaint:  Fall from standing     HPI:  Tasha [name] is a 84 y.o. White female who presents from [facility] after a witnessed ground level fall. Patient had CT scan completed at [facility] which showed traumatic SAH/SDH with minimal midline shift. Patient admitted to N[facility]U under NSGY service for further treatment and evaluation.        Past Medical History:   Diagnosis Date   . Coronary artery disease    . CVA (cerebrovascular accident) (CMS HCC)    . Diverticulitis    . Hypertension          Past Surgical History:   Procedure Laterality Date   . BREAST CYST EXCISION Bilateral    . EYE SURGERY     . HIP SURGERY     . HX CHOLECYSTECTOMY     . HX HYSTERECTOMY           Medications Prior to Admission     Prescriptions    aspirin (ECOTRIN) 81 mg Oral Tablet, Delayed Release (E.C.)    Take 81 mg by mouth Once a day    atenolol (TENORMIN) 50 mg Oral Tablet    Take 50 mg by mouth Once a day    calcium carbonate 500 mg calcium (1,250 mg) Oral Tablet    Take 500 mg by mouth Once a day    Cholecalciferol, Vitamin D3, (VITAMIN D-3) 5,000 unit Oral Tablet    Take by mouth Every 7 days    cholestyramine-aspartame (PREVALITE) 4 gram Oral Powder in Packet    Take 4 g by mouth Every evening with dinner    estradiol (ESTRACE) 0.01 % (0.1 mg/gram) Vaginal Cream    2 g by Vaginal route Every Monday, Wednesday and Friday for 90 days    gabapentin (NEURONTIN) 100 mg Oral Capsule    Take 100 mg by mouth    L.acid/L.casei/B.bif/B.lon/FOS (PROBIOTIC BLEND ORAL)    Take by mouth    latanoprost (XALATAN) 0.005 % Ophthalmic Drops    Instill 1 Drop into both eyes Every evening     levothyroxine (SYNTHROID) 125 mcg Oral Tablet    Take 125 mcg by mouth Every morning    magnesium oxide (MAG-OX) 400 mg (241.3 mg magnesium) Oral Tablet    Take 400 mg by mouth Twice daily    memantine (NAMENDA) 10 mg Oral Tablet    Take 10 mg by mouth Twice daily    mirabegron (MYRBETRIQ) 50 mg Oral Tablet Sustained Release 24 hr    Take 1 Tab (50 mg total) by mouth Once a day for 30 days    mirtazapine (REMERON) 30 mg Oral Tablet    Take 30 mg by mouth Every night    oxyCODONE-acetaminophen (PERCOCET) 7.5-325 mg Oral Tablet    Take 1 Tab by mouth Every 4 hours as needed for Pain  Current Facility-Administered Medications:  acetaminophen (TYLENOL) tablet 650 mg Oral Q4H PRN   ertapenem (INVANZ) 1,000 mg in NS 50 mL IVPB 1,000 mg Intravenous Now   levETIRAcetam (KEPPRA) 500 mg in iso-osmotic 100 mL premix IVPB 500 mg Intravenous Q12H   levETIRAcetam (KEPPRA) tablet 250 mg Oral 2x/day   NS bolus infusion 40 mL 40 mL Intravenous Once PRN   NS premix infusion  Intravenous Continuous     Allergies   Allergen Reactions   . Furadantin [Nitrofurantoin]    . Morphine      Social History     Tobacco Use   . Smoking status: Never Smoker   . Smokeless tobacco: Never Used   Substance Use Topics   . Alcohol use: Never     Frequency: Never     Family Medical History:     None               ROS: Other than ROS in the HPI, all other systems were negative.    EXAM:  Temperature: 38 C (100.4 F)  Heart Rate: (!) 101  BP (Non-Invasive): 125/68  Respiratory Rate: 20  SpO2-1: 96 %  General: acutely ill  HENT:Head atraumatic and normocephalic, ENT without erythema or injection, mucous membranes moist.  Neck: No JVD or thyromegaly or lymphadenopathy  Carotids:Carotids normal without bruit  Lungs: Clear to auscultation bilaterally. , Clear to auscultation and percussion bilaterally.   Cardiovascular: regular rate and rhythm, S1, S2 normal, no murmur, click, rub or gallop  Abdomen: Soft, non-tender, Bowel sounds normal, Non-tender,  non-distended  Extremities: No cyanosis or edema, extremities normal, atraumatic, no cyanosis or edema  Ophthalomscopic: normal w/o hemorrhages, exudates, or papilledema  Glasgow: Eye opening: 4 spontaneous, Verbal resonse:  4 confused , Best motor response:  6 obeys commands  GCS Total Score: 14, confused to situation. PEERL, 5/5 Strength Bilaterally.  Mental status:  Level of Consciousness: alert  Orientations: Disoriented to place  MemoryRegistration, Recall, and Following of commands is normal  AttentionsAttention decreased and Concentration decreased  Knowledge: Poor  Language: Slight dysarthria   Speech: Normal  Cranial nerves:   CN2: Visual acuity and fields intact  CN 3,4,6: EOMI, PERRLA  CN 5Facial sensation intact  CN 7Face symmetrical  CN 8: Hearing grossly intact  CN 9,10: Palate symmetric and gag normal  CN 11: Sternocleidomastoid and Trapezius have normal strength.  CN 12: Tongue normal with no fasiculations or deviation  Gait, Coordination, and Reflexes:   Gait: Normal and Unable to ambulate  Coordination: Coordination is normal without tremor  Reflexes: Reflexes are 2/2 throughout  Muscle tone: WNL  Arm Right Left Leg Right Left   Deltoid 5/5 5/5 Iliopsoas 5/5 5/5   Biceps 5/5 5/5 Quads 5/5 5/5   Triceps 5/5 5/5 Hamstrings 5/5 5/5   Wrist Extension 5/5 5/5 Ankle Dorsi Flexion 5/5 5/5   Wrist Flexion 5/5 5/5 Ankle Plantar Flexion 5/5 5/5   Interossei 5/5 5/5 Ankle Eversion 5/5 5/5   APB 5/5 5/5 Ankle Inversion 5/5 5/5       Reflexes   RJ BJ TJ KJ AJ Plantars Hoffman's   Right 2+ 2+ 2+ 2+ 2+ Downgoing Not present   Left 2+ 2+ 2+ 2+ 2+ Downgoing Not present     Sensory: Sensory exam in the upper and lower extremities is normal  Normal color, texture and turgor without significant lesions or rashes  Diabetes Monitors:  Patient not a diabetic.    Labs:    I have  reviewed all lab results.    Independent Interpretation of images or specimens:      DNR Status this admission:  Full Code  Palliative/Supportive  Care consulted?  no  Hospice Consulted?  Not applicable    Current Comorbid Conditions - Neurology H&[name]  Brain Compression:  yes  Obstructive Hydrocephalus:  no  Coma (GCS less than 8): Coma -Not applicable  Stroke: Not applicable  Cerebral Edema:  no  Encephalopathy:  no  Encephalitis-Not applicable  Seizure-Not applicable   Respiratory Failure/Other-Not applicable  Coagulopathy Not applicable        Assessment/ Plan:   There are no active hospital problems to display for this patient.      Neurological: Mechanical fall from standing resulting in tSDH/SAH. Known ASA use, given 1u platelets in ED   -Q1 neuro checks and vitals, at risk for acute decline  -CT Brain 0300 2/22   -Keppra 250mg  BIS x 7 days for seizure ppx  -Maintain eunatremia/euvolemia   -Avoid antiplatelets and antiplatelets medications  -PT/OT/SS  -STAT CT Brain for any acute change in mental status    Cardiovascular:  HTN   -Maintain SBP <140    -Home atenolol 50mg  QDay    Respiratory:    -Maintain sats >94%    Renal:  Uncomplicated UTI  W/ corresponding AKI   -Ertapenem 1g given in ED   -Cr 1.35 , BUN 28, GFR 37  -Na 45ml.hr     Nutrition:  NPO pending S/S    Infectious Disease: UTI   -Ertapenem given in ED     Hematology:     -Known ASA use, given 1 u platelets in ED    Gastrointestinal:     -colace, senna    Endocrine:    -SSI protocol    Psychiatric:     -Monitor for delerium    Prophylaxis:    DVT/PE Prophylaxis: SCDs/ Venodynes/Impulse boots  GI:  Colace, Senna    Family:  Not at bedside  Disposition/ Baseline:  Remain ICU status, repeat CT, tx if stable  Advance Directives:  None-Discussed  Hospice involvement prior to admission?  no      Keturah Barre, APRN, NP-C  Neurocritical Care  Pager # [phone]  02/03/2018 16:04

## 2018-02-03 NOTE — ED Nurses Note (Signed)
[facility] in [facility]. All questions answered.

## 2018-02-03 NOTE — ED Nurses Note (Signed)
Patient presents to the ED as transfer from [facility] for witnessed ground level fall. CT scan at outlying showed SAH/SDH with minimal midline shift. P2 activation. Patient placed on cardiac monitor and placed on 2 L/min via NC. Patient at baseline mental status 4/4/6. Patient had no other complaints.

## 2018-02-03 NOTE — ED Nurses Note (Signed)
Pt arrived to ED 17, ED and trauma services at bedside. Pt coming from [facility] via [employer] [facility]. Trauma assessment in progress at this time.

## 2018-02-03 NOTE — ED Provider Notes (Signed)
Department of Emergency Medicine  HPI - 02/03/2018    Attending: Dr. Jeanine Luz  Resident: Dr. Denman George    Chief Complaint:   P2 Trauma   History of Present Illness:   Tasha [name], 84 y.o. female   02/03/2018, 14:03   P2 Trauma page for fall from standing.    Pt transported by EMS from [facility] with no LOC.    Patient was not backboarded. Patient was not collared.    Vitals were stable (lowest BP 854'O systolic, highest HR 270). GCS of 14 on arrival.   Patient had fall from standing witnessed by her daughter. No LOC. Seen at [facility], CT imaging showed SAH and SDH. Received labetalol at [facility] prior to transport. Transferred to this facility for further evaluation. Patient is complaining of headache at this time. PMH dementia, COPD, and CAD.      History Limitations: Hx obtained from EMS.     Review of Systems:   Constitutional: No fever, chills or weakness   Skin: No rashes or diaphoresis   HENT: No congestion +headache    Eyes: No vision changes, discharge   Cardio: No chest pain, palpitations or leg swelling    Respiratory: No cough, wheezing or SOB   GI:  No nausea, vomiting, diarrhea, constipation or abdominal pain   GU:  No dysuria, hematuria, polyuria   MSK: No joint or back pain   Neuro: No loss of sensation, focal deficits or LOC  All other systems reviewed and are negative.     Medications:  Prior to Admission Medications   Prescriptions Last Dose Informant Patient Reported? Taking?   Cholecalciferol, Vitamin D3, (VITAMIN D-3) 5,000 unit Oral Tablet   Yes No   Sig: Take by mouth Every 7 days   L.acid/L.casei/B.bif/B.lon/FOS (PROBIOTIC BLEND ORAL)   Yes No   Sig: Take by mouth   aspirin (ECOTRIN) 81 mg Oral Tablet, Delayed Release (E.C.)   Yes No   Sig: Take 81 mg by mouth Once a day   atenolol (TENORMIN) 50 mg Oral Tablet   Yes No   Sig: Take 50 mg by mouth Once a day   calcium carbonate 500 mg calcium (1,250 mg) Oral Tablet   Yes No   Sig: Take 500 mg by mouth Once a  day   cholestyramine-aspartame (PREVALITE) 4 gram Oral Powder in Packet   Yes No   Sig: Take 4 g by mouth Every evening with dinner   estradiol (ESTRACE) 0.01 % (0.1 mg/gram) Vaginal Cream   No No   Sig: 2 g by Vaginal route Every Monday, Wednesday and Friday for 90 days   gabapentin (NEURONTIN) 100 mg Oral Capsule   Yes No   Sig: Take 100 mg by mouth   latanoprost (XALATAN) 0.005 % Ophthalmic Drops   Yes No   Sig: Instill 1 Drop into both eyes Every evening   levothyroxine (SYNTHROID) 125 mcg Oral Tablet   Yes No   Sig: Take 125 mcg by mouth Every morning   magnesium oxide (MAG-OX) 400 mg (241.3 mg magnesium) Oral Tablet   Yes No   Sig: Take 400 mg by mouth Twice daily   memantine (NAMENDA) 10 mg Oral Tablet   Yes No   Sig: Take 10 mg by mouth Twice daily   mirabegron (MYRBETRIQ) 50 mg Oral Tablet Sustained Release 24 hr   No No   Sig: Take 1 Tab (50 mg total) by mouth Once a day for 30 days  mirtazapine (REMERON) 30 mg Oral Tablet   Yes No   Sig: Take 30 mg by mouth Every night   oxyCODONE-acetaminophen (PERCOCET) 7.5-325 mg Oral Tablet   Yes No   Sig: Take 1 Tab by mouth Every 4 hours as needed for Pain      Facility-Administered Medications: None       Allergies:  Allergies   Allergen Reactions   . Furadantin [Nitrofurantoin]    . Morphine        Past Medical History:  Past Medical History:   Diagnosis Date   . Coronary artery disease    . CVA (cerebrovascular accident) (CMS [place])    . Diverticulitis    . Hypertension            Past Surgical History:  Past Surgical History:   Procedure Laterality Date   . BREAST CYST EXCISION Bilateral    . EYE SURGERY     . HIP SURGERY     . HX CHOLECYSTECTOMY     . HX HYSTERECTOMY             Social History:  Social History     Socioeconomic History   . Marital status: Unknown     Spouse name: Not on file   . Number of children: Not on file   . Years of education: Not on file   . Highest education level: Not on file   Social Needs   . Financial resource strain: Not on file      . Food insecurity - worry: Not on file   . Food insecurity - inability: Not on file   . Transportation needs - medical: Not on file   . Transportation needs - non-medical: Not on file   Occupational History   . Not on file   Tobacco Use   . Smoking status: Never Smoker   . Smokeless tobacco: Never Used   Substance and Sexual Activity   . Alcohol use: Never     Frequency: Never   . Drug use: Not on file   . Sexual activity: Not on file   Other Topics Concern   . Not on file   Social History Narrative   . Not on file       Family History:  Family Medical History:     None              Physical Exam:  Vitals:   Filed Vitals:    02/03/18 1439 02/03/18 1442 02/03/18 1444 02/03/18 1445   BP:  (!) 152/71  (!) 142/72   Pulse: 97  (!) 101    Resp: 16  19    Temp:       SpO2: 96%  98%      Constitutional: GCS 14.  HENT:   Head: No external signs of trauma  Mouth/Throat: Midface stable. No malocclusion.  Eyes: EOMI. Pupils are 3 mm, round and reactive bilaterally.  Ears: TMs are intact bilaterally. No hematomas.  Nose: No nasal septal hematoma. No gross deformity.  Neck: No C-collar in place. No midline C-spine tenderness with no step offs or obvious deformities.  Cardiovascular: RRR. Pulses present in all 4 extremity.   Pulmonary/Chest: BS equal bilaterally. No tenderness or ecchymosis.  Abdomen: Suprapubic tenderness. No ecchymosis.     Musculoskeletal:   Pelvis: No instability.   Back: Mid thoracic midline tenderness. No step offs or deformities.   Extremities: No gross deformities. R hip TTP  GU: Rectal deferred.  Skin: No laceration, No abrasion.  Neuro: No focal neurological deficits. GCS as above.  Psych: normal mood and affect    Nursing notes/chart reviewed.    Labs:  Results for orders placed or performed during the hospital encounter of 02/03/18 (from the past 24 hour(s))   CBC/DIFF    Narrative    The following orders were created for panel order CBC/DIFF.  Procedure                               Abnormality          Status                     ---------                               -----------         ------                     CBC WITH TGYB[[id number]]                Abnormal            Final result                 Please view results for these tests on the individual orders.   PT/INR   Result Value Ref Range    PROTHROMBIN TIME 11.7 9.5 - 14.1 seconds    INR 0.99 0.80 - 1.20    Narrative    Coumadin therapy INR range for Conventional Anticoagulation is 2.0 to 3.0 and for Intensive Anticoagulation 2.5 to  3.5.   PTT (PARTIAL THROMBOPLASTIN TIME)   Result Value Ref Range    APTT 28.4 24.1 - 38.5 seconds    Narrative    Therapeutic range for unfractionated heparin is 60-100  seconds.   URINALYSIS WITH MICROSCOPIC REFLEX IF INDICATED    Narrative    The following orders were created for panel order URINALYSIS WITH MICROSCOPIC REFLEX IF INDICATED.  Procedure                               Abnormality         Status                     ---------                               -----------         ------                     URINALYSIS, MACRO/MICRO[[id number]]                                                       Please view results for these tests on the individual orders.   VENOUS BLOOD GAS/COOX/LYTES/LAC   Result Value Ref Range    %FIO2 (VENOUS) 21.0 %    PH (VENOUS) 7.41 7.31 - 7.41    PCO2 (VENOUS) 39.00 (L) 41.00 - 51.00 mm/Hg    PO2 (VENOUS)  37.0 35.0 - 50.0 mm/Hg    BASE EXCESS 0.1 -3.0 - 3.0 mmol/L    BICARBONATE (VENOUS) 24.4 22.0 - 26.0 mmol/L    SODIUM 135 (L) 136 - 145 mmol/L    WHOLE BLOOD POTASSIUM 4.5 3.5 - 5.0 mmol/L    CHLORIDE 106 96 - 111 mmol/L    IONIZED CALCIUM 1.18 1.10 - 1.36 mmol/L    GLUCOSE 93 60 - 105 mg/dL    LACTATE 3.0 (H) 0.0 - 1.3 mmol/L    HEMOGLOBIN 12.6 12.0 - 18.0 g/dL    OXYHEMOGLOBIN 72.2 40.0 - 80.0 %    CARBOXYHEMOGLOBIN 1.4 0.0 - 2.5 %    MET-HEMOGLOBIN 1.3 0.0 - 3.5 %    O2CT 12.8 6.7 - 17.5 %   CBC WITH DIFF   Result Value Ref Range    WBC 7.7 3.5 - 11.0 x10^3/uL    RBC 4.12 3.63 - 4.92  x10^6/uL    HGB 12.3 11.2 - 15.2 g/dL    HCT 37.8 33.5 - 45.2 %    MCV 91.9 78.0 - 100.0 fL    MCH 29.9 27.4 - 33.0 pg    MCHC 32.5 32.5 - 35.8 g/dL    RDW 14.2 12.0 - 15.0 %    PLATELETS 196 140 - 450 x10^3/uL    MPV 10.7 7.5 - 11.5 fL    NEUTROPHIL % 82 %    LYMPHOCYTE % 8 %    MONOCYTE % 9 %    EOSINOPHIL % 1 %    BASOPHIL % 1 %    NEUTROPHIL # 6.37 1.50 - 7.70 x10^3/uL    LYMPHOCYTE # 0.61 (L) 1.00 - 4.80 x10^3/uL    MONOCYTE # 0.67 0.30 - 1.00 x10^3/uL    EOSINOPHIL # 0.05 0.00 - 0.50 x10^3/uL    BASOPHIL # 0.04 0.00 - 0.20 x10^3/uL       Imaging:    Results for orders placed or performed during the hospital encounter of 02/03/18 (from the past 72 hour(s))   XR CHEST AP MOBILE     Status: None    Narrative    Kristyn P Plaisted  Female, 84 years old.    XR AP MOBILE CHEST performed on 02/03/2018 2:14 PM.    REASON FOR EXAM:  Chest Trauma    TECHNIQUE: 1 views/1 images submitted for interpretation.    COMPARISON:  None      Impression    There is eventration of the right hemidiaphragm. No consolidation or  pneumothorax or gross displaced fracture seen. There is some right hilar  prominence which likely represents vessels.     CT BRAIN WO IV CONTRAST     Status: None    Narrative    Tajuana P Wrage  Female, 84 years old.    CT BRAIN WO IV CONTRAST performed on 02/03/2018 2:41 PM.    REASON FOR EXAM:  Head Trauma    RADIATION DOSE: 1358.70 mGycm    COMPARISON: None    FINDINGS:  The images demonstrate a right cerebral convexity subdural  hematoma measuring about 6 mm in maximal thickness. There is no significant  mass effect. No midline shift or herniation seen. This also small quantity  of subarachnoid hemorrhage over the right frontal lobe. No fracture  identified. No significant soft tissue contusion or hematoma identified.  Paranasal sinuses are clear.      Impression    Small right cerebral convex to the subdural hematoma and small quantity of  right frontal subarachnoid hemorrhage.  No significant intracranial  mass  effect.         Orders Placed This Encounter   . XR CHEST AP MOBILE   . XR PELVIS   . ED [place] FAST EXAM                 . CT BRAIN WO IV CONTRAST   . CT TRAUMA CHEST ABDOMEN PELVIS WO IV CONTRAST   . CT BRAIN WO IV CONTRAST   . CBC/DIFF   . PT/INR   . PTT (PARTIAL THROMBOPLASTIN TIME)   . BUN   . CREATININE   . ETHANOL, SERUM   . URINALYSIS WITH MICROSCOPIC REFLEX IF INDICATED   . DRUG SCREEN, LOW OPIATE CUTOFF, NO CONFIRMATION, URINE   . VENOUS BLOOD GAS/COOX/LYTES/LAC   . CBC WITH DIFF   . URINALYSIS, MACRO/MICRO   . OXYGEN - NON REBREATHER MASK   . ECG 12-LEAD   . TYPE AND SCREEN   . PRODUCT: PLATELETS - UNITS NON IRRADIATED, 1 Units   . NS premix infusion   . NS bolus infusion 40 mL   . levETIRAcetam (KEPPRA) 500 mg in iso-osmotic 100 mL premix IVPB   . diphtheria, pertussis-acell, tetanus (BOOSTRIX) IM injection   . levETIRAcetam (KEPPRA) tablet   . acetaminophen (TYLENOL) tablet   . ertapenem (INVANZ) 1,000 mg in NS 50 mL IVPB       Abnormal Lab results:  Labs Reviewed   VENOUS BLOOD GAS/LACTATE/CO-OX/LYTES (NA/K/CA/CL/GLUC) - Abnormal; Notable for the following components:       Result Value    PCO2 (VENOUS) 39.00 (*)     SODIUM 135 (*)     LACTATE 3.0 (*)     All other components within normal limits   CBC WITH DIFF - Abnormal; Notable for the following components:    LYMPHOCYTE # 0.61 (*)     All other components within normal limits   PT/INR - Normal    Narrative:     Coumadin therapy INR range for Conventional Anticoagulation is 2.0 to 3.0 and for Intensive Anticoagulation 2.5 to  3.5.   PTT (PARTIAL THROMBOPLASTIN TIME) - Normal    Narrative:     Therapeutic range for unfractionated heparin is 60-100  seconds.   CBC/DIFF    Narrative:     The following orders were created for panel order CBC/DIFF.  Procedure                               Abnormality         Status                     ---------                               -----------         ------                     CBC WITH BLTJ[[id number]]                 Abnormal            Final result                 Please view results for these tests on the individual orders.   BUN   CREATININE   ETHANOL, SERUM   URINALYSIS WITH  MICROSCOPIC REFLEX IF INDICATED    Narrative:     The following orders were created for panel order URINALYSIS WITH MICROSCOPIC REFLEX IF INDICATED.  Procedure                               Abnormality         Status                     ---------                               -----------         ------                     URINALYSIS, MACRO/MICRO[[id number]]                                                       Please view results for these tests on the individual orders.   DRUG SCREEN, NO CONFIRMATION, URINE   URINALYSIS, MACRO/MICRO   TYPE AND SCREEN   PRODUCT: PLATELETS - UNITS       FAST: Negative. See procedure note.    Plan: Appropriate labs and imaging ordered. Medical Records reviewed.    Therapy/Procedures/Course/MDM:   Pt was paged as a P2 trauma. Trauma team was present upon patient arrival to the ED.  Patient was vitally stable throughout visit.   Admit neurosurgery    Consults:   Trauma at bedside on arrival to ED.   Neurosurgery  Impression:     Subdural hematoma    Disposition:     Admit Neurosurgery    I am scribing for, and in the presence of, Dr. Denman George for services provided on 02/03/2018  Teresita Madura, SCRIBE    // Teresita Madura, [place]  02/03/2018, 14:09  I personally performed the services described in this documentation, as scribed  in my presence, and it is both accurate  and complete.    Denman George, MD  Denman George, M.D. 02/04/2018  PGY-3 - Department of Emergency Medicine   [facility]

## 2018-02-03 NOTE — ED Nurses Note (Signed)
Patient tolerated platelet infusion well. No reaction noted. Family at bedside.

## 2018-02-03 NOTE — Consults (Signed)
[facility]  Neurosurgery Consult  Initial Consult Note      Tasha, [name], 84 y.o. female  Date of Admission:  02/03/2018  Date of Service: 02/03/2018  Date of Birth:  03-15-1928    Primary Service: Trauma  Reason for Consult: SDH      Information Obtained from: patient, health care provider and history reviewed via medical record  Chief Complaint: fall    HPI:  Tasha [name] is a 84 y.o., Unknown female w/ hx of HTN, frequent UTI and dementia resides with daughter on ASA for general health presenting following a fall. Fall was witnessed by family. Patient amnestic to the event. Witnesses report that she had dizziness and fell. Patient reported HA. Remained oriented. Found to have UTI and SBP in 190s at [facility]. Family reports that patient had afib at [facility]. CT revealed small R SDH without MLS.    ROS: (MUST comment on all "Abnormal" findings)  Other than ROS in the HPI, all other systems were negative.    PAST MEDICAL/ FAMILY/ SOCIAL HISTORY:   Past Medical History:   Diagnosis Date   . CVA (cerebrovascular accident) (CMS [place])    . Diverticulitis    . Hypertension          Medications Prior to Admission     Prescriptions    aspirin (ECOTRIN) 81 mg Oral Tablet, Delayed Release (E.C.)    Take 81 mg by mouth Once a day    atenolol (TENORMIN) 50 mg Oral Tablet    Take 50 mg by mouth Once a day    calcium carbonate 500 mg calcium (1,250 mg) Oral Tablet    Take 500 mg by mouth Once a day    Cholecalciferol, Vitamin D3, (VITAMIN D-3) 5,000 unit Oral Tablet    Take by mouth Every 7 days    cholestyramine-aspartame (PREVALITE) 4 gram Oral Powder in Packet    Take 4 g by mouth Every evening with dinner    estradiol (ESTRACE) 0.01 % (0.1 mg/gram) Vaginal Cream    2 g by Vaginal route Every Monday, Wednesday and Friday for 90 days    gabapentin (NEURONTIN) 100 mg Oral Capsule    Take 100 mg by mouth    L.acid/L.casei/B.bif/B.lon/FOS (PROBIOTIC BLEND ORAL)    Take by mouth    latanoprost (XALATAN) 0.005 % Ophthalmic Drops     Instill 1 Drop into both eyes Every evening    levothyroxine (SYNTHROID) 125 mcg Oral Tablet    Take 125 mcg by mouth Every morning    magnesium oxide (MAG-OX) 400 mg (241.3 mg magnesium) Oral Tablet    Take 400 mg by mouth Twice daily    memantine (NAMENDA) 10 mg Oral Tablet    Take 10 mg by mouth Twice daily    mirabegron (MYRBETRIQ) 50 mg Oral Tablet Sustained Release 24 hr    Take 1 Tab (50 mg total) by mouth Once a day for 30 days    mirtazapine (REMERON) 30 mg Oral Tablet    Take 30 mg by mouth Every night    oxyCODONE-acetaminophen (PERCOCET) 7.5-325 mg Oral Tablet    Take 1 Tab by mouth Every 4 hours as needed for Pain           Current Facility-Administered Medications:  cefTRIAXone (ROCEPHIN) 1 g in D5W 50 mL IVPB 1 g Intravenous Q24H   levETIRAcetam (KEPPRA) 500 mg in iso-osmotic 100 mL premix IVPB 500 mg Intravenous Q12H   NS bolus infusion 40 mL  40 mL Intravenous Once PRN   NS premix infusion  Intravenous Continuous     Allergies   Allergen Reactions   . Furadantin [Nitrofurantoin]    . Morphine      Past Surgical History:   Procedure Laterality Date   . BREAST CYST EXCISION Bilateral    . EYE SURGERY     . HIP SURGERY     . HX CHOLECYSTECTOMY     . HX HYSTERECTOMY           Social History     Tobacco Use   . Smoking status: Never Smoker   . Smokeless tobacco: Never Used   Substance Use Topics   . Alcohol use: Never     Frequency: Never   . Drug use: Not on file     Family Medical History:     None              PHYSICAL EXAMINATION: (MUST comment on all "Abnormal" findings)    Constitutional  Temperature: 38 C (100.4 F)  Heart Rate: (!) 117  BP (Non-Invasive): (!) 156/83  Respiratory Rate: (!) 21  SpO2-1: 94 %  Appears stated age, NAD  A&Ox3  GCS _0 Fluent speech  Appropriate fund of knowledge  Appropriate attention span & concentration  Appropriate recent and remote memory  CN 2 PERRL  CN _1 EOMI  CN 7 Face symmetric  CN 8 Hearing grossly intact  CN 11 shrug symmetric  CN 12 Tongue  midline  Muscle Strength 5/5 BUE and BLE  Muscle tone WNL  SILT BUE and BLE  No drift  No hoffman   No clonus    Labs Ordered/ Reviewed : (Please indicate ordered or reviewed)  Reviewed: Labs:  Lab Results Today:    Results for orders placed or performed during the hospital encounter of 02/03/18 (from the past 24 hour(s))   VENOUS BLOOD GAS/COOX/LYTES/LAC   Result Value Ref Range    %FIO2 (VENOUS) 21.0 %    PH (VENOUS) 7.41 7.31 - 7.41    PCO2 (VENOUS) 39.00 (L) 41.00 - 51.00 mm/Hg    PO2 (VENOUS) 37.0 35.0 - 50.0 mm/Hg    BASE EXCESS 0.1 -3.0 - 3.0 mmol/L    BICARBONATE (VENOUS) 24.4 22.0 - 26.0 mmol/L    SODIUM 135 (L) 136 - 145 mmol/L    WHOLE BLOOD POTASSIUM 4.5 3.5 - 5.0 mmol/L    CHLORIDE 106 96 - 111 mmol/L    IONIZED CALCIUM 1.18 1.10 - 1.36 mmol/L    GLUCOSE 93 60 - 105 mg/dL    LACTATE 3.0 (H) 0.0 - 1.3 mmol/L    HEMOGLOBIN 12.6 12.0 - 18.0 g/dL    OXYHEMOGLOBIN 72.2 40.0 - 80.0 %    CARBOXYHEMOGLOBIN 1.4 0.0 - 2.5 %    MET-HEMOGLOBIN 1.3 0.0 - 3.5 %    O2CT 12.8 6.7 - 17.5 %       Current Comorbid Conditions - Neurosurgery Consult  Compression of the Brain:  no  Coma less than 8: Coma -Not applicable  Loss of Consciousness: no  Cerebral Hemorrhage: Not applicable  Craniectomy: no  Seizure: Seizure-Not applicable  Respiratory Failure/Other-Not applicable  No     Coagulopathy: Not applicable  N/A    Impression/Recommendations  [name] is a 84 y.o. female w/ hx of HTN and dementia on ASA presenting following a fall found to have small R SDH. PTD0    -- No acute neurosurgical intervention  --  Neuro checks q1  -- Keep SBP < 166mHg  -- Keppra 2573mBID x 7 days for seizure ppx, end 2/28  -- No hypotonic fluids, Maintain Eunatremia  -- No anticoagulants/antiplatelets   -- Plts given for ASA use  -- FU UCx   -- Will discuss with [facility]    -- Ertapenem administered in ED  -- Cervical tenderness with collar removal   -- Aspen at all times  -- Imaging:   -- CT brain wo: ordered   -- CT CAP w/: no acute  injury   -- CT cervical wo on Image Grid: no acute injury   -- CT brain wo (02/03/18 on Image Grid): small R SDH without MLS  -- Pain/spasm control: Tylenol PRN  -- Diet: regular  -- Bowel regimen, last BM PTA  -- Abx: Ertapenem administered in ED   -- UCx 2/21  -- Activity: ambulate as tolerated w/ assist   -- Aspen at all times  -- PT/OT: ordered   -- DVT ppx: SCDs/Venodynes  -- Consults:    Trauma   -- FU recs  -- Lines/Drains: none  -- Wound: none  -- Disposition: TBD        JeLeta BaptistMD      Late entry for 02/04/18. I saw and examined the patient.  I reviewed the resident's note.  I agree with the findings and plan of care as documented in the resident's note.  Any exceptions/additions are edited/noted.    CaLeeroy ChaMD

## 2018-02-03 NOTE — Nurses Notes (Signed)
Pt admitted to 520 from ED.  Pt stable, placed on monitor.  VS and assessment per flowsheet.  Pt A&Ox3.  Neuro checks continued Q1h.  Aspen collar on.  Will monitor.

## 2018-02-03 NOTE — H&P (Signed)
I have seen and evaluated this patient. Refer to H&P and progress notes for more detailed assessment and plan.  Tasha RayAlison Kalima Saylor, MD  02/03/2018, 14:31

## 2018-02-03 NOTE — Incoming ED Transfer Note (Signed)
[place]  Witnessed fall from standing, - LOC.  Takes ASA.  Dx with R 8mm subdural and R frontal Subarachnoid subdural.   B/P 166/88.  Daughter at bedside.  20g R hand.  Coming via ALS.

## 2018-02-03 NOTE — Incoming ED Transfer Note (Signed)
Valla P Lottie MusselVanscoy is a 84 y.o. female + fall witnessed by family this morning. AOX3 complaining of headache. + UTI 190 systolic on arrival given labetalol now down to 150's. + ASA acute subdural and subarachnoid with minimal shift.

## 2018-02-04 ENCOUNTER — Ambulatory Visit (HOSPITAL_BASED_OUTPATIENT_CLINIC_OR_DEPARTMENT_OTHER): Payer: Self-pay | Admitting: Urology

## 2018-02-04 ENCOUNTER — Inpatient Hospital Stay (HOSPITAL_COMMUNITY): Payer: Medicare Other

## 2018-02-04 ENCOUNTER — Telehealth (INDEPENDENT_AMBULATORY_CARE_PROVIDER_SITE_OTHER): Payer: Self-pay | Admitting: Neurological Surgery

## 2018-02-04 ENCOUNTER — Encounter (HOSPITAL_COMMUNITY): Payer: Self-pay | Admitting: Pediatrics

## 2018-02-04 DIAGNOSIS — I361 Nonrheumatic tricuspid (valve) insufficiency: Secondary | ICD-10-CM

## 2018-02-04 DIAGNOSIS — R55 Syncope and collapse: Secondary | ICD-10-CM

## 2018-02-04 DIAGNOSIS — R5381 Other malaise: Secondary | ICD-10-CM

## 2018-02-04 DIAGNOSIS — S065X9A Traumatic subdural hemorrhage with loss of consciousness of unspecified duration, initial encounter: Secondary | ICD-10-CM

## 2018-02-04 DIAGNOSIS — F0390 Unspecified dementia without behavioral disturbance: Secondary | ICD-10-CM

## 2018-02-04 DIAGNOSIS — I340 Nonrheumatic mitral (valve) insufficiency: Secondary | ICD-10-CM

## 2018-02-04 DIAGNOSIS — I2510 Atherosclerotic heart disease of native coronary artery without angina pectoris: Secondary | ICD-10-CM

## 2018-02-04 DIAGNOSIS — E039 Hypothyroidism, unspecified: Secondary | ICD-10-CM

## 2018-02-04 LAB — CBC
HCT: 33.5 % (ref 33.5–45.2)
HGB: 11.2 g/dL (ref 11.2–15.2)
MCH: 30.6 pg (ref 27.4–33.0)
MCHC: 33.6 g/dL (ref 32.5–35.8)
MCV: 91.1 fL (ref 78.0–100.0)
MPV: 10 fL (ref 7.5–11.5)
PLATELETS: 209 x10ˆ3/uL (ref 140–450)
RBC: 3.67 x10ˆ6/uL (ref 3.63–4.92)
RDW: 14.4 % (ref 12.0–15.0)
WBC: 5.9 x10ˆ3/uL (ref 3.5–11.0)

## 2018-02-04 LAB — ECG 12-LEAD
Atrial Rate: 118 {beats}/min
Calculated P Axis: 68 degrees
Calculated R Axis: 48 degrees
Calculated T Axis: 75 degrees
PR Interval: 154 ms
QRS Duration: 74 ms
QT Interval: 328 ms
QTC Calculation: 459 ms
Ventricular rate: 118 {beats}/min

## 2018-02-04 LAB — BASIC METABOLIC PANEL
ANION GAP: 7 mmol/L (ref 4–13)
BUN/CREA RATIO: 23 — ABNORMAL HIGH (ref 6–22)
BUN/CREA RATIO: 23 — ABNORMAL HIGH (ref 6–22)
BUN: 31 mg/dL — ABNORMAL HIGH (ref 8–25)
CALCIUM: 8.7 mg/dL (ref 8.5–10.2)
CHLORIDE: 105 mmol/L (ref 96–111)
CO2 TOTAL: 24 mmol/L (ref 22–32)
CREATININE: 1.33 mg/dL — ABNORMAL HIGH (ref 0.49–1.10)
ESTIMATED GFR: 38 mL/min/1.73mˆ2 — ABNORMAL LOW (ref >59)
GLUCOSE: 104 mg/dL (ref 65–139)
POTASSIUM: 4.2 mmol/L (ref 3.5–5.1)
SODIUM: 136 mmol/L (ref 136–145)

## 2018-02-04 LAB — MAGNESIUM: MAGNESIUM: 1.7 mg/dL (ref 1.6–2.5)

## 2018-02-04 LAB — TYPE AND SCREEN
ABO/RH(D): A POS
ANTIBODY SCREEN: NEGATIVE

## 2018-02-04 LAB — PRODUCT: PLATELETS - UNITS: UNIT DIVISION: 0

## 2018-02-04 LAB — PHOSPHORUS: PHOSPHORUS: 3.7 mg/dL (ref 2.3–4.0)

## 2018-02-04 MED ORDER — FOSFOMYCIN TROMETHAMINE 3 GRAM ORAL PACKET
3.00 g | PACK | ORAL | Status: AC
Start: 2018-02-04 — End: 2018-02-08
  Administered 2018-02-06 – 2018-02-08 (×2): 3 g via ORAL
  Filled 2018-02-04 (×3): qty 1

## 2018-02-04 MED ORDER — SODIUM CHLORIDE 0.9 % INTRAVENOUS SOLUTION
2.00 mL | INTRAVENOUS | Status: AC
Start: 2018-02-04 — End: 2018-02-04
  Administered 2018-02-04: 2 mL via INTRAVENOUS

## 2018-02-04 MED ORDER — SODIUM CHLORIDE 0.9 % INTRAVENOUS SOLUTION
INTRAVENOUS | Status: DC
Start: 2018-02-04 — End: 2018-02-06

## 2018-02-04 MED ADMIN — memantine 10 mg tablet: ORAL | @ 21:00:00

## 2018-02-04 NOTE — Care Plan (Signed)
Problem: Adult Inpatient Plan of Care  Goal: Plan of Care Review  Outcome: Ongoing (see interventions/notes)  Goal: Patient-Specific Goal (Individualization)  Outcome: Ongoing (see interventions/notes)  Goal: Absence of Hospital-Acquired Illness or Injury  Outcome: Ongoing (see interventions/notes)  Goal: Optimal Comfort and Wellbeing  Outcome: Ongoing (see interventions/notes)  Goal: Rounds/Family Conference  Outcome: Ongoing (see interventions/notes)     Problem: Fall Injury Risk  Goal: Absence of Fall and Fall-Related Injury  Outcome: Ongoing (see interventions/notes)     Problem: Skin Injury Risk Increased  Goal: Skin Health and Integrity  Outcome: Ongoing (see interventions/notes)     Problem: Acute Rehab Services Goal & Intervention Plan  Goal: Bathing Goal  Description  Stand Alone Therapy Goal  Outcome: Ongoing (see interventions/notes)  Goal: Bed Mobility Goal  Description  Stand Alone Therapy Goal  Outcome: Ongoing (see interventions/notes)  Goal: Caregiver Training Goal  Description  Stand Alone Therapy Goal  Outcome: Ongoing (see interventions/notes)  Goal: Cognition Goal  Description  Stand Alone Therapy Goal  Outcome: Ongoing (see interventions/notes)  Goal: Cognition Goals, SLP  Description  Stand Alone Therapy Goal  Outcome: Ongoing (see interventions/notes)  Goal: Communication Goals, SLP  Description  Stand Alone Therapy Goal  Outcome: Ongoing (see interventions/notes)  Goal: Dysphagia Goals, SLP  Description  Stand Alone Therapy Goal  Outcome: Ongoing (see interventions/notes)  Goal: Eating Self-Feeding Goal  Description  Stand Alone Therapy Goal  Outcome: Ongoing (see interventions/notes)  Goal: Gait Training Goal  Description  Stand Alone Therapy Goal  Outcome: Ongoing (see interventions/notes)  Goal: Grooming Goal  Description  Stand Alone Therapy Goal  Outcome: Ongoing (see interventions/notes)  Goal: Home Management Goal  Description  Stand Alone Therapy Goal  Outcome: Ongoing (see  interventions/notes)  Goal: Interprofessional Goal  Description  Stand Alone Therapy Goal  Outcome: Ongoing (see interventions/notes)  Goal: LB Dressing Goal  Description  Stand Alone Therapy Goal  Outcome: Ongoing (see interventions/notes)  Goal: Occupational Therapy Goals  Description  Stand Alone Therapy Goal  Outcome: Ongoing (see interventions/notes)  Goal: Physical Therapy Goal  Description  Stand Alone Therapy Goal  Outcome: Ongoing (see interventions/notes)  Goal: Range of Motion Goal  Description  Stand Alone Therapy Goal  Outcome: Ongoing (see interventions/notes)  Goal: Strength Goal  Description  Stand Alone Therapy Goal  Outcome: Ongoing (see interventions/notes)  Goal: Toileting Goal  Description  Stand Alone Therapy Goal  Outcome: Ongoing (see interventions/notes)  Goal: Goal Transfer Training  Description  Stand Alone Therapy Goal  Outcome: Ongoing (see interventions/notes)  Goal: UB Dressing Goal  Description  Stand Alone Therapy Goal  Outcome: Ongoing (see interventions/notes)     Problem: Health Knowledge, Opportunity to Enhance (Adult,Obstetrics,Pediatric)  Goal: Knowledgeable about Health Subject/Topic  Description  Patient will demonstrate the desired outcomes by discharge/transition of care.  Outcome: Ongoing (see interventions/notes)

## 2018-02-04 NOTE — Care Plan (Signed)
[place]  Physical Therapy Initial Evaluation    Patient Name: Tasha [name]  Date of Birth: Oct 23, 1928  Height: Height: 160 cm (5' 2.99")  Weight: Weight: 62 kg (136 lb 11 oz)  Room/Bed: 22/A  Payor: MEDICARE / Plan: MEDICARE PART A AND B / Product Type: Medicare /     Assessment:      Patient tolerated PT evaluation fairly. She did have hypotension throughout, but was largely asymptomatic. Patient demonstrated decreased balance, decreased endurance, and overall decreased functional mobility. PT recommends SNF placement when patient is medically ready for d/c. PT will continue to follow patient during acute care stay for increased mobility and updated recommendations.    Discharge Needs:    Equipment Recommendation: TBD       Discharge Disposition: skilled nursing facility    JUSTIFICATION OF DISCHARGE RECOMMENDATION   Based on current diagnosis, functional performance prior to admission, and current functional performance, this patient requires continued PT services in skilled nursing facility in order to achieve significant functional improvements in these deficit areas: aerobic capacity/endurance, ergonomics and body mechanics, gait, locomotion, and balance, muscle performance.    Plan:   Current Intervention: balance training, bed mobility training, gait training, home exercise program, neuromuscular re-education, patient/family education, strengthening, transfer training  To provide physical therapy services 1x/day, minimum of 3x/week  for duration of until goals are met.    The risks/benefits of therapy have been discussed with the patient/caregiver and he/she is in agreement with the established plan of care.       Subjective & Objective        02/04/18 1132   Therapist Pager   PT Assigned/ Pager # [phone]   Rehab Session   Document Type evaluation   Total PT Minutes: 30   Patient Effort adequate   Symptoms Noted During/After Treatment fatigue   Symptoms Noted Comment  Pt lethargic and with low BP throughout session.   General Information   Patient Profile Reviewed? yes   Onset of Illness/Injury or Date of Surgery 02/03/18   Pertinent History of Current Functional Problem 84 y.o. Unknown female s/p fall from standing, witness by family. No loss of consciousness but is amnestic to events. Went to [place] where CT brain showed R subdural. Was transferred to [facility].  On evaluation here she has a mild headache and complains of right hip pain. She is confused and cannot recall the events leading up to her hospitalization.  She is unsure of where she is or what happened today.  Per report, has history of HTN, dementia, COPD.   Medical Lines PIV Line;Telemetry   Respiratory Status nasal cannula  (1L)   Existing Precautions/Restrictions DNR;fall precautions;oxygen therapy device and L/min   Mutuality/Individual Preferences   Individualized Care Needs OOB with assist of 2.   Living Environment   Lives With child(ren), adult  (daughter)   Living Arrangements house   Home Assessment: No Problems Identified   Home Accessibility ramps present at home   [place] obtained from granddaughter. Patient lives with her daughter in a 1 level house with ramp to enter.   Functional Level Prior   Ambulation 1 - assistive equipment   Transferring 1 - assistive equipment   Toileting 0 - independent   Bathing 1 - assistive equipment   Dressing 0 - independent   Prior Functional Level Comment Information obtained from granddaughter. Patient used FWW or cane for ambulation, although patient frequently did not use device for  ambulation.    Self-Care   Equipment Currently Used at Home yes   Equipment Currently Used at [employer], Careers information officer, rolling;shower chair   Pre Treatment Status   Pre Treatment Patient Status Patient supine in bed;Call light within reach;Telephone within reach;Sitter select activated;Nurse approved session   Support Present Pre Treatment  Family present      Communication Pre Treatment  Nurse   Communication Pre Treatment Comment Session approved by nurse.   Cognitive Assessment/Interventions   Behavior/Mood Observations cooperative;flat affect;lethargic   Attention mild impairment   Follows Commands follows two step commands   Vital Signs   Pre-Treatment Heart Rate (beats/min) 88   Post-treatment Heart Rate (beats/min) 90   Pre-Treatment Resp Rate (breaths/min) 17   Post-treatment Resp Rate (breaths/min) 17   Pre Treatment BP 98/53   Post Treatment BP 91/46   Pre SpO2 (%) 96   O2 Delivery Pre Treatment supplemental O2   Post SpO2 (%) 97   O2 Delivery Post Treatment supplemental O2   Vitals Comment Pt reported dizziness while seated EOB. BP seated EOB 105/59. Pt reported decreased dizziness with rest break. BP seated in chair 87/49, after ~1 minute seated in chair 87/44. With feet elevated seated in chair 91/46. RN made aware.    Pain Assessment   Pre/Post Treatment Pain Comment Patient reports headache, but did not formally rate.   RLE Assessment   RLE Assessment WFL for stated baseline   LLE Assessment   LLE Assessment WFL for stated baseline   Trunk Assessment   Trunk Assessment WFL for stated baseline   Mobility Assessment/Training   Additional Documentation Bed Mobility Assessment/Treatment (Group);Transfer Assessment/Treatment (Group)   Bed Mobility Assessment/Treatment   Supine-Sit Independence minimum assist (75% patient effort);2 person assist required   Sit to Supine, Independence not tested   Safety Issues decreased use of arms for pushing/pulling   Impairments balance impaired;coordination impaired;endurance;strength decreased   Transfer Assessment/Treatment   Sit-Stand Independence minimum assist (75% patient effort);contact guard assist;2 person assist required   Stand-Sit Independence minimum assist (75% patient effort);contact guard assist;2 person assist required   Sit-Stand-Sit, Assist Device other (see comments)  (side by side PT/OT)   Bed-Chair  Independence minimum assist (75% patient effort);verbal cues required;2 person assist required   Chair-Bed Independence not tested   Bed-Chair-Bed Assist Device other (see comments)  (side by side PT/OT)   Transfer Safety Issues balance decreased during turns;sequencing ability decreased   Transfer Impairments balance impaired;coordination impaired;endurance;postural control impaired;strength decreased   Transfer Comment Patient initially required min A of 2 for sit to stand, but later required CGA of 1 for sit to stand.   Balance Skill Training   Comment with assist   Sitting Balance: Static fair + balance   Sitting, Dynamic (Balance) fair balance   Sit-to-Stand Balance fair balance   Standing Balance: Static fair balance   Standing Balance: Dynamic fair - balance   Systems Impairment Contributing to Balance Disturbance neuromuscular;musculoskeletal   Identified Impairments Contributing to Balance Disturbance impaired coordination;decreased strength  (endurance)   Post Treatment Status   Post Treatment Patient Status Patient sitting in bedside chair or w/c;Call light within reach;Telephone within reach;Sitter select activated   Support Present Post Treatment  Family present   Clinical research associate Post Treatment Comment Discussed patient's mobility status with bedside nurse.   Plan of Care Review   Plan Of Care Reviewed With patient;family   Physical Therapy Clinical Impression   Assessment Patient tolerated PT evaluation fairly.  She did have hypotension throughout, but was largely asymptomatic. Patient demonstrated decreased balance, decreased endurance, and overall decreased functional mobility. PT recommends SNF placement when patient is medically ready for d/c. PT will continue to follow patient during acute care stay for increased mobility and updated recommendations.   Criteria for Skilled Therapeutic yes;meets criteria   Pathology/Pathophysiology Noted  musculoskeletal;neuromuscular   Impairments Found (describe specific impairments) aerobic capacity/endurance;ergonomics and body mechanics;gait, locomotion, and balance;muscle performance   Functional Limitations in Following  self-care;home management;community/leisure   Disability: Inability to Perform community/leisure   Rehab Potential good, to achieve stated therapy goals   Therapy Frequency 1x/day;minimum of 3x/week   Predicted Duration of Therapy Intervention (days/wks) until goals are met   Anticipated Equipment Needs at Discharge (PT) TBD   Anticipated Discharge Disposition skilled nursing facility   Highest level of Mobility score   Exercise Level 4- Transferred to chair/commode   Evaluation Complexity Justification   Patient History: Co-morbidity/factors that impact Plan of [facility] or more other medical co-morbidity;Complicated prior level of function/decline in function;Patient/family compliance concerns   Examination Components Vital signs (BP, HR, RR, or O2 saturation);Strength;Balance;Bed mobility;Transfers   Presentation Evolving: Symptoms, complaints, characteristics of condition changing &/or cognitive deficits present   Clinical Decision Making Moderate complexity   Evaluation Complexity Moderate complexity   Care Plan Goals   PT Rehab Goals Bed Mobility Goal;Gait Training Goal;Transfer Training Goal   Bed Mobility Goal   Bed Mobility Goal, Date Established 02/04/18   Bed Mobility Goal, Time to Achieve by discharge   Bed Mobility Goal, Activity Type all bed mobility activities   Bed Mobility Goal, Independence Level modified independence   Bed Mobility Goal, Assistive Device least restrictive assistive device   Gait Training  Goal, Distance to Achieve   Gait Training  Goal, Date Established 02/04/18   Gait Training  Goal, Time to Achieve by discharge   Gait Training  Goal, Independence Level supervision required   Gait Training  Goal, Assist Device least restricted assistive device   Gait  Training  Goal, Distance to Achieve 15   Transfer Training Goal   Transfer Training Goal, Date Established 02/04/18   Transfer Training Goal, Time to Achieve by discharge   Transfer Training Goal, Activity Type all transfers   Transfer Training Goal, Independence Level supervision required   Transfer Training Goal, Assist Device least restrictrictive assistive device   Planned Therapy Interventions, PT Eval   Planned Therapy Interventions (PT) balance training;bed mobility training;gait training;home exercise program;neuromuscular re-education;patient/family education;strengthening;transfer training       Therapist:   Sandi Carne, PT   Pager #: [phone]

## 2018-02-04 NOTE — Care Plan (Signed)
Following cultures/labs,  PT/OT recommending SNF placement,  TTE today and on Tele. Anticipate  placement when medically clear for discharge.    Tasha MccreedyBarbara confirmed PCP and pharmacy. Tasha MccreedyBarbara stated she helps pt with her medications and that the pt saw her PCP a week ago.  Obtained copy of [employer] [place] insurance card. Copy is on chart.

## 2018-02-04 NOTE — Telephone Encounter (Addendum)
-----   Message from Jesse Lawrence, MD sent at 02/04/2018  4:37 PM EST -----  Levan HurstPlease change 2 week FU to be with Dr. Rod HollerSedney and not with Dr. Bennett ScrapeBrandmeir. Thank you.    Verdon CumminsJesse    -----------------------------------------------------------------------------------  Done while inpatient.  Natale LayMichelle D Princess Karnes, LPN  6/01/09322/22/2019, 17:05

## 2018-02-04 NOTE — Care Plan (Signed)
[place]  Occupational Therapy Initial Evaluation    Patient Name: Tasha [name]  Date of Birth: [birth year]  Height: Height: 160 cm (5' 2.99")  Weight: Weight: 62 kg (136 lb 11 oz)  Room/Bed: 22/A  Payor: MEDICARE / Plan: MEDICARE PART A AND B / Product Type: Medicare /     Assessment:   Ms [place] tolerated OT consult fairly well. Pt lethargic throughout therapy session requiring vc/tc for alertness. Pt demonstrated equal MMT testing for BUE. Pt demonstrates decreased balance, endurance, activity tolerance, & strength. Pt requiring assist x1-2 for most ADLs, ADL transfers, SPT transfer, & bed mobility. Will continue to follow pt. Would recommend SNF when pt medically appropriate for d/c.      Discharge Needs:   Equipment Recommendation: to be determined  Discharge Disposition: skilled nursing facility    JUSTIFICATION OF DISCHARGE RECOMMENDATION   Based on current diagnosis, functional performance prior to admission, and current functional performance, this patient requires continued OT services in skilled nursing facility  in order to achieve significant functional improvements.    Plan:   Current Intervention: ADL retraining, balance training, bed mobility training, endurance training, strengthening, transfer training    To provide Occupational therapy services 1x/day, minimum of 2x/week, until discharge, until goals are met.       The risks/benefits of therapy have been discussed with the patient/caregiver and he/she is in agreement with the established plan of care.       Subjective & Objective        02/04/18 1131   Therapist Pager   OT Assigned/ Pager # Yovanny Coats [phone]   Rehab Session   Document Type evaluation   Total OT Minutes: 30   Patient Effort adequate   Symptoms Noted During/After Treatment fatigue   Symptoms Noted Comment Pt lethargic throughout therapy session    General Information   Patient Profile Reviewed? yes   Pertinent History of Current Functional Problem  Jahnai Turvey is a 84 y.o. White female who presents from [facility] after a witnessed ground level fall. Patient had CT scan completed at [facility] which showed traumatic SAH/SDH with minimal midline shift. Patient admitted to NCCU under [place] service for further treatment and evaluation.    Medical Lines PIV Line;Telemetry   Respiratory Status nasal cannula  (1L)   Existing Precautions/Restrictions DNR;fall precautions;oxygen therapy device and L/min   Pre Treatment Status   Pre Treatment Patient Status Patient supine in bed;Call light within reach;Telephone within reach;Sitter select activated;Nurse approved session   Support Present Pre Treatment  Family present   Communication Pre Treatment  Nurse   Communication Pre Treatment Comment Pt/RN agreeable to OT consult, seen with professional assist of PT    Mutuality/Individual Preferences   Individualized Care Needs OOB A x2    Living Environment   Lives With child(ren), adult  (daughter)   Living Arrangements house   Home Assessment: No Problems Identified   Home Accessibility ramps present at home   [place] from family. no stairs to manage. walk in shower    Functional Level Prior   Ambulation 1 - assistive equipment   Transferring 1 - assistive equipment   Toileting 0 - independent   Bathing 1 - assistive equipment   Dressing 0 - independent   Eating 0 - independent   Prior Functional Level Comment Obtained from family. typically IND prior for basic ADLs. Use of sc, cane, & FWW at baseline. Per pt family, multiple falls recently  Self-Care   Current Activity Tolerance fair   Equipment Currently Used at Home yes   Equipment Currently Used at Home cane, straight;walker, rolling;shower chair   Vital Signs   Pre-Treatment Heart Rate (beats/min) 88   Post-treatment Heart Rate (beats/min) 86   Pre Treatment BP 98/53   Post Treatment BP 91/46   Pre SpO2 (%) 96   O2 Delivery Pre Treatment supplemental O2   Post SpO2 (%) 96   O2 Delivery Post  Treatment supplemental O2   Vitals Comment Pt reported dizziness while seated EOB. BP seated EOB 105/59. Pt reported decreased dizziness with rest break. BP seated in chair 87/49, after ~1 minute seated in chair 87/44. With feet elevated seated in chair 91/46. RN made aware.    Pain Assessment   Pre/Post Treatment Pain Comment headache discomfort, pt unable to formally rate. RN aware    Coping/Psychosocial Response Interventions   Plan Of Care Reviewed With patient;family   Cognitive Assessment/Interventions   Behavior/Mood Observations cooperative;flat affect;lethargic   Orientation Status oriented to;person;situation;verbal cues/prompts needed for orientation   Attention mild impairment   Follows Commands follows two step commands;repetition of directions required;verbal cues/prompting required   Comment Pt lethargic throughout therapy session. Pt requiring vc/tc for alertness. Pt very soft spoken.    RUE Assessment   RUE Assessment X- Exceptions   RUE ROM grossly WFL    RUE Strength grossly 3/5   LUE Assessment   LUE Assessment X-Exceptions   LUE ROM grossly WFL    LUE Strength grossly 3/5   Bed Mobility Assessment/Treatment   Supine-Sit Independence minimum assist (75% patient effort);2 person assist required   Sit to Supine, Independence not tested   Safety Issues decreased use of arms for pushing/pulling;decreased use of legs for bridging/pushing;impaired trunk control for bed mobility   Impairments balance impaired;coordination impaired;endurance;strength decreased;postural control impaired   Transfer Assessment/Treatment   Sit-Stand Independence minimum assist (75% patient effort);contact guard assist;2 person assist required   Stand-Sit Independence minimum assist (75% patient effort);contact guard assist;2 person assist required   Sit-Stand-Sit, Assist Device   (side by side PT/OT)   Bed-Chair Independence minimum assist (75% patient effort);verbal cues required;2 person assist required   Chair-Bed  Independence not tested   Bed-Chair-Bed Assist Device   (side by side PT/OT)   Transfer Safety Issues balance decreased during turns;sequencing ability decreased   Transfer Impairments balance impaired;coordination impaired;endurance;postural control impaired;strength decreased   Transfer Comment pt intially requiring Min A x2 for STS, after multiple STS transfers pt able to complete with CGA   Upper Body Dressing Assessment/Training   Comment Pt incontinent of bladder upon arrival. Pt requiring Max A to doff/donn hospital gown    Lower Body Dressing Assessment/Training   Comment Pt attempted to adjust socks however unsuccessful, would require Dep A at this time to donn socks   Toileting Assessment/Training   Comment use of bed pan while seated EOB requiring DEP A for clothing mangement & peri-hygiene    Balance Skill Training   Comment side by side PT/OT    Sitting Balance: Static fair + balance   Sitting, Dynamic (Balance) fair balance   Sit-to-Stand Balance fair balance   Standing Balance: Static fair balance   Standing Balance: Dynamic fair - balance   Identified Impairments Contributing to Balance Disturbance impaired coordination;impaired postural control;decreased strength  (endurance)   Therapeutic Exercise/Activity   Comment decreased fxnl activity tolerance noted   Post Treatment Status   Post Treatment Patient Status Patient sitting in bedside chair  or w/c;Call light within reach;Telephone within reach;Sitter select activated   Support Present Post Treatment  Family present   Financial trader Nurse   Care Plan Goals   OT Rehab Goals Bed Mobility Goal;Grooming Goal;LB Dressing Goal;Toileting Goal;Transfer Training Goal;UB Dressing Goal   Bed Mobility Goal   Bed Mobility Goal, Date Established 02/04/18   Bed Mobility Goal, Time to Achieve by discharge   Bed Mobility Goal, Activity Type all bed mobility activities   Bed Mobility Goal, Independence Level modified independence   Grooming Goal      Grooming Goal, Date Established 02/04/18   Grooming Goal, Time to Achieve by discharge   Grooming Goal, Activity Type all grooming tasks   Grooming Goal, Independence  stand-by assistance   LB Dressing Goal   LB Dressing Goal, Date Established 02/04/18   LB Dressing Goal, Time to Achieve by discharge   LB Dressing Goal, Activity Type all lower body dressing tasks   LB Dressing Goal, Independence Level stand-by assistance   Toileting Goal   Toileting Goal, Date Established 02/04/18   Toileting Goal, Time to Achieve by discharge   Toileting Goal, Activity Type all toileting tasks   Toileting Goal, Independence Level stand-by assistance   Transfer Training Goal   Transfer Training Goal, Date Established 02/04/18   Transfer Training Goal, Time to Achieve by discharge   Transfer Training Goal, Activity Type all transfers   Transfer Training Goal, Independence Level stand-by assistance   UB Dressing Goal   UB Dressing  Goal, Date Established 02/04/18   UB Dressing Goal, Time to Achieve by discharge   UB Dressing Goal, Activity Type all upper body dressing tasks   UB Dressing Goal, Independence Level modified independence   Planned Therapy Interventions, OT Eval   Planned Therapy Interventions ADL retraining;balance training;bed mobility training;endurance training;strengthening;transfer training   Occupational Therapy Clinical Impression   Functional Level at Time of Session Ms [place] tolerated OT consult fairly well. Pt lethargic throughout therapy session requiring vc/tc for alertness. Pt demonstrated equal MMT testing for BUE. Pt demonstrates decreased balance, endurance, activity tolerance, & strength. Pt requiring assist x1-2 for most ADLs, ADL transfers, SPT transfer, & bed mobility. Will continue to follow pt. Would recommend SNF when pt medically appropriate for d/c.   Criteria for Skilled Therapeutic Interventions Met (OT) yes;meets criteria;skilled treatment is necessary   Rehab Potential fair, will monitor  progress closely   Therapy Frequency 1x/day;minimum of 2x/week   Predicted Duration of Therapy until discharge;until goals are met   Anticipated Equipment Needs at Discharge to be determined   Anticipated Discharge Disposition skilled nursing facility   Evaluation Complexity Justification   Occupational Profile Review Expanded review   Performance Deficits 3-5 deficits;Endurance;Balance;Mobility;Strength;Attention  (ADLs, MRADLs )   Clinical Decision Making Moderate analytic complexity   Evaluation Complexity Moderate       Therapist:   Sabino Snipes, MOT, OTR/L    Pager #: [phone]

## 2018-02-04 NOTE — Nurses Notes (Signed)
Patient admitted to 1022 from overflow. Patient is lethargic but arouses to verbal command. Family at bedside.

## 2018-02-04 NOTE — Consults (Signed)
[facility]  MEDICINE CONSULT    Tasha [name], Tasha [name], 84 y.o. female  Date of Birth:  [birth year]  Date of Admission:  02/03/2018  Date of service: 02/04/2018    Service: Col[facility] Flowers  Requesting MD: Dr. Anice Paganini    Reason for consultation: syncope work up    Assessment/Recommendation(s):  Tasha [name] is an 84 year old female who was transferred from [facility] with a PMH of dementia, CAD, CVA, and HTN who was admitted s/p fall and found to have a small right convexity SDH.     SAD/SDH s/p fall  - Per NSGY, no acute surgical intervention at this time.  - Repeat CT brain demonstrates stable hemorrhage.  - SBP goal <160.   - Na goal eunatremia.   - Started on Keppra 250 mg BID x 7 day for seizure PPX (end date 02/10/2018).  - Hold anti-coagulants or anti-platelets until seen by NSGY for follow up in the outpatient setting.  - OK to resume DVT chemoPPX on Sunday, 02/06/2018.   - Aspen collar removed today (02/04/2018).  Cleared by trauma service.   - PT/OT consulted.    Possible syncope  - Continue telemetry.  - Order echo.    Suspect UTI - present on admission (h/o recurrent UTI with ESBL)  - UCx (2/21) from [facility] growing >100,000 gram negative rods.  Will call their microbiology lab at [phone] to get final results tomorrow.     - Received a one time dose of Ertapenem in the ED.  - Started on fosfomycin x 3 doses today (end date 02/08/2018).     CAD  - Continue atenolol 50 mg daily.    - HOLD aspirin.     Dementia  - Continue Namenda 10 mg BID.     Hypothyroidism  - Continue Synthroid 125 mcg daily.     Physical deconditioning  - PT/OT consulted.  Will likely need SNF versus inpatient rehab.     Transfer to [facility] from NSGY service.      HPI:  Tasha [name] an 84 year old female who was transferred from [facility] with a PMH of dementia, CAD, CVA, and HTN who was admitted s/p fall and found to have a small right convexity SDH.  Patient had a witnessed fall by family members yesterday morning.  Per  her grandson who is at bedside, he was told by other family members that it looked like she was getting up from a seated position to go to the bathroom, and then she fell backwards. No loss of consciousness.  The patient is unsure of the events leading up to her fall.   At the outside facility, the patient had a systolic blood pressure of 190 and received labetalol.  Patient also has history of recurrent UTIs that have grown ESBL.  UA at the outside facility was significant for a UTI and therefore received a dose of ertapenem in the ED.  Initial CT brain without contrast shows a small right subdural hemorrhage with minimal midline shift.    The patient was initially admitted to the neurosurgery service.  No acute surgical intervention was required.  Repeat imaging is stable.  Neuro surgery requesting transfer to Medicine service for syncope workup.  Patient is currently complaining of a frontal headache.  No N/V, or abdominal pain.  Denies any fevers, chills, or URI symptoms including nasal congestion, rhinorrhea, cough, or shortness of breath.  No chest pain or palpitations.        Past Medical  History:   Diagnosis Date   . Coronary artery disease    . CVA (cerebrovascular accident) (CMS HCC)    . Diverticulitis    . Hypertension        Past Surgical History:   Procedure Laterality Date   . BREAST CYST EXCISION Bilateral    . EYE SURGERY     . HIP SURGERY     . HX CHOLECYSTECTOMY     . HX HYSTERECTOMY         Allergies   Allergen Reactions   . Furadantin [Nitrofurantoin]    . Morphine        Family History  Family Medical History:     Problem Relation (Age of Onset)    No Known Problems Mother, Father          Social History  Social History     Tobacco Use   . Smoking status: Never Smoker   . Smokeless tobacco: Never Used   Substance Use Topics   . Alcohol use: Never     Frequency: Never        Medications Prior to Admission     Prescriptions    aspirin (ECOTRIN) 81 mg Oral Tablet, Delayed Release (E.C.)    Take 81  mg by mouth Once a day    atenolol (TENORMIN) 50 mg Oral Tablet    Take 50 mg by mouth Once a day    calcium carbonate 500 mg calcium (1,250 mg) Oral Tablet    Take 500 mg by mouth Once a day    Cholecalciferol, Vitamin D3, (VITAMIN D-3) 5,000 unit Oral Tablet    Take by mouth Every 7 days    cholestyramine-aspartame (PREVALITE) 4 gram Oral Powder in Packet    Take 4 g by mouth Every evening with dinner    estradiol (ESTRACE) 0.01 % (0.1 mg/gram) Vaginal Cream    2 g by Vaginal route Every Monday, Wednesday and Friday for 90 days    gabapentin (NEURONTIN) 100 mg Oral Capsule    Take 100 mg by mouth    L.acid/L.casei/B.bif/B.lon/FOS (PROBIOTIC BLEND ORAL)    Take by mouth    latanoprost (XALATAN) 0.005 % Ophthalmic Drops    Instill 1 Drop into both eyes Every evening    levothyroxine (SYNTHROID) 125 mcg Oral Tablet    Take 125 mcg by mouth Every morning    magnesium oxide (MAG-OX) 400 mg (241.3 mg magnesium) Oral Tablet    Take 400 mg by mouth Twice daily    memantine (NAMENDA) 10 mg Oral Tablet    Take 10 mg by mouth Twice daily    mirabegron (MYRBETRIQ) 50 mg Oral Tablet Sustained Release 24 hr    Take 1 Tab (50 mg total) by mouth Once a day for 30 days    mirtazapine (REMERON) 30 mg Oral Tablet    Take 30 mg by mouth Every night    oxyCODONE-acetaminophen (PERCOCET) 7.5-325 mg Oral Tablet    Take 1 Tab by mouth Every 4 hours as needed for Pain          Current Facility-Administered Medications:  acetaminophen (TYLENOL) tablet 650 mg Oral Q4H PRN   atenolol (TENORMIN) tablet 50 mg Oral Daily   cholestyramine-aspartame (PREVALITE) packet 4 g Oral Daily with Dinner   docusate sodium (COLACE) capsule 100 mg Oral 2x/day   fosfomycin (MONUROL) 3 g oral packet 3 g Oral EVERY OTHER DAY   hydrALAZINE (APRESOLINE) injection 10 mg 10 mg Intravenous Q4H PRN  labetalol (TRANDATE) 5 mg/mL injection 5 mg Intravenous Q2H PRN   levETIRAcetam (KEPPRA) tablet 250 mg Oral 2x/day   levothyroxine (SYNTHROID) tablet 125 mcg Oral QAM      memantine (NAMENDA) tablet 10 mg Oral 2x/day   mirtazapine (REMERON) tablet 30 mg Oral NIGHTLY   NS flush syringe 2 mL Intracatheter Q8HRS   And      NS flush syringe 2-6 mL Intracatheter Q1 MIN PRN   ondansetron (ZOFRAN) 2 mg/mL injection 4 mg Intravenous Q6H PRN   sennosides-docusate sodium (SENOKOT-S) 8.6-50mg  per tablet 1 Tab Oral Daily   travoprost (TRAVATAN) 0.004% ophthalmic solution 1 Drop Both Eyes NIGHTLY       ROS: Other than ROS in the HPI, all other systems were negative.     EXAM:  Temperature: 37.7 C (99.9 F)  Heart Rate: 94  BP (Non-Invasive): 103/88  Respiratory Rate: 20  SpO2-1: 94 %  Constitutional: No acute distress, elderly, and vital signs reviewed  Eyes: Conjunctiva clear, pupils equal and round, sclera non-icteric, EOMI  Neck: Supple  Respiratory: Clear to auscultation bilaterally, no wheezes or crackles  Cardiovascular: S1S2, regular rate and rhythm, no murmur, no LE edema  Gastrointestinal: Soft, non-tender, non-distended, normoactive bowel sounds  Musculoskeletal: Moves all 4 extremities, SCDs in place  Integumentary:  Skin warm and dry, no rashes or lesions  Neurologic: Alert but somnolent, able to answer some questions appropriately, not oriented to place but is oriented to time, no focal neurologic deficits      Studies:  I have reviewed all available studies within the electronic medical record.      Clydene Pugh, DO  Assistant Professor of Medicine & Pediatrics  Section of Hospitalist Medicine  [facility]  [phone]

## 2018-02-04 NOTE — Nurses Notes (Signed)
Patient is lethargic, arouses to verbal commands.Marland Kitchen. NAST will be preformed at a later time when patient is more alert. Service notified.

## 2018-02-04 NOTE — ED Attending Note (Signed)
I saw and examined the patient.  I directly supervised the patient's care.  I discussed the patient's care with the resident/APP and I agree with the findings, diagnosis, and plan as documented in the primary note.  Any exceptions, additions, or corrections are noted below.    MDM - re-evaluated pt, reviewed old records, reviewed labs, reviewed imaging, IV therapy was given, reviewed and interpreted ECGs, discussed care with consulting service, hemo monitoring, reviewed transfer data    Fall from standing, SDH, SAH  Some somnolence  Supportive care, trauma workup, admit ICU    Procedures: bedside US    CRITICAL CARE: Patient presented with/developed acute traumatic brain injury. I spent 32 minutes while the patient was in this condition providing evaluation, supportive care, patient and family education, coordination with nursing and other staff. My care also included initial evaluation and stabilization, review of data, re-examination, discussion with admitting and consulting services to arrange definitive care, and was exclusive of any procedures performed. In addition, I reviewed the resident's documentation and agree with the assessment and plan of care with corrections, additions and/or clarifications as noted.

## 2018-02-04 NOTE — Nurses Notes (Signed)
02/04/18 1140   Vital Signs   BP (Non-Invasive) (!) 101/52   MAP (Non-Invasive) 67 mmHG       Service notified of blood pressure.  Will monitor.

## 2018-02-04 NOTE — Consults (Signed)
Trauma Tertiary Exam Note    S:  84 y/o female PTD 1 s/p fall. Pt doing well this AM. Complaining of a headache, but no other complaints. Pt says she does not remember how she fell yesterday. Denies cp, sob, paresthesias.    O:  GEN:   NAD  HEENT:   Normocephalic; atraumatic.  NECK:   Non-tender to palpation, full ROM without pain or neurologic changes.  PULM:   Lung sounds clear to auscultation bilaterally.  Normal respiratory effort.    CV:   Regular rate and rhythm.  ABD:   Abdomen soft, nontender, and nondistended.    MS: Atraumatic.  Grossly moving all extremities  NEURO:   GCS 15   Integumentary:  Pink, warm, and dry     A:  84 y/o female with SDH    P:  -trauma tertiary exam negative  -c collar cleared clinically, CT cspine negative at [facility]  -will sign off, please call with questions      [facility]    The patient's cervical collar was removed today.  The patient's imaging was reviewed and the CT C-Spine was negative for acute injury.      The patient was alert and oriented prior to her exam.  On exam she was non-tender to palpation midline and had full ROM without any neurologic defecits.  The patient tolerated this procedure well.      Marquis Buggylaire Leinhauser, PA-C 02/04/2018, 07:55     Trauma/Surgical Critical Care/Acute Care Surgery Staff  I saw and examined the patient.  I reviewed the Medical sales representativehysican Assistant note .  I agree with their findings and plan of care as documented in their note.  Any exceptions/additions are edited/noted.  Admitted overnight, s/p fall with TBI and traumatic Subdural hemorrhage  Sleepy but arousable, follows commands  Spine: visual and palpation exam of the Cervical, Thoracic, and Lumbar Spine does does not demonstrate stepoff, deformity, or elicit tenderness.    Chest: symmetric chest rise, no crepitus or chest wall instability  Abdomen: soft, non-tender, non-distended, normal bowel sounds.  Pelvis: stable to compression, non-tender  Extremities: all present, no  deformity or tenderness  Reviewed imaging done during initial evaluation, no traumatic injuries evident beyond tSDH  No further imaging studies recommended from our perspective  Further care per Neurosurgery/NCCU  Please call with any questions, will signoff  Electronically Signed by:    Harden MoGregory P. Dexter Signor, DO, FACS  Associate Professor of Surgery  Trauma, Surgical Critical Care, and Acute Care Surgery  02/04/2018 18:29

## 2018-02-04 NOTE — Ancillary Notes (Signed)
SBIRT    SBIRT completed, which includes mental and substance use/abuse questions, as per [facility] SBIRT model.  Pending recommendations:  None.    Dierdre Harnessorey Frona Yost, PHD, [facility] Clinical Therapist 02/04/2018, 11:50    Pager  [phone]

## 2018-02-04 NOTE — Care Management Notes (Signed)
[facility] Management Initial Evaluation    Patient Name: Tasha [name]  Date of Birth: Jun 17, 1928  Sex: female  Date/Time of Admission: 02/03/2018  2:03 PM  Room/Bed: 22/A  Payor: MEDICARE / Plan: MEDICARE PART A AND B / Product Type: Medicare /   Primary Care Providers:  Joanell Rising, MD, MD (General)    Pharmacy Info:   Preferred Pharmacy     [facility], [place] - [address]    [place] [address]    Phone: [phone] Fax: [phone]    Not a 24 hour pharmacy; exact hours not known        Emergency Contact Info:   Extended Emergency Contact Information  Primary Emergency Contact: [place] Phone: [phone]  Relation: Daughter    History:   Tasha [name] is a 84 y.o., female, admitted for Subdural hematoma.     Height/Weight: 160 cm (5' 2.99") / 62 kg (136 lb 11 oz)     LOS: 1 day   Admitting Diagnosis: Subdural hematoma (CMS [facility]) [B84.6K5L]    Assessment:      02/04/18 1556   Assessment Details   Assessment Type Admission   Date of Care Management Update 02/04/18   Date of Next DCP Update 02/07/18   Readmission   Is this a readmission? No   Care Management Plan   Discharge Planning Status initial meeting   Projected Discharge Date 02/08/18   CM will evaluate for rehabilitation potential yes   Patient choice offered to patient/family no   Form for patient choice reviewed/signed and on chart no   Discharge Needs Assessment   Equipment Currently Used at Home shower chair;walker, rolling   Equipment Needed After Discharge other (see comments)  (Pending Evals.)   Community Agency Name(s) Reports using [facility] in [place] in the past.    Discharge Facility/Level of Care Needs SNF vs Rehab   Transportation Available car;family or friend will provide  (Pt's daughter, Tasha [name], stated she could transport. )   Referral Information   Admission Type inpatient   Address Verified verified-no changes   Arrived From acute  hospital, other   [place] Verified verified-updates forwarded to [facility]   ADVANCE DIRECTIVES   Does the Patient have an Advance Directive? Yes, Patient Does Have Advance Directive for Healthcare Treatment   Document the Substance of the Advance Directive (Required) MPOA/LW   Type of Advance Directive Completed Medical Living Will;Medical Power of Attorney   Copy of Advance Directives in Chart? 0   Name of [place]; Tasha [name]   Phone Number of Tasha [name] or Healthcare Surrogate [phone]; [phone]   Patient Requests Assistance in Having Advance Directive Notarized. N/A   LAY CAREGIVER    Appointed Lay Caregiver? I Decline   Employment/Financial   Patient has Prescription Coverage?  Yes  ([facility] [place])        Name of Insurance Coverage for Medications Medicare   Financial Concerns none   Living Environment   Select an age group to open "lives with" row.  Adult   Lives With child(ren), adult;other relative(s) (specify)  (Pt resides with her daughter, Tasha [name], and Barb's husband, Tasha [name]. )   Living Arrangements house   Able to Return to Prior Arrangements yes   Home Safety   Home Assessment: No Problems Identified   Home Accessibility other (see comments)  (ramps  present at the house. )   Legal Issues   Do you have a court appointed guardian/conservator? No     MSW met with pt, her daughter/MPOA Tasha [name]), son-in-law Tasha [name]), grandson Tasha [name]), granddaughter (Tasha [name]) and great-granddaughter to complete assessment. Following cultures/labs,  PT/OT recommending SNF placement,  TTE today and on Tele. Anticipate  placement when medically clear for discharge.    Tasha [name] confirmed PCP and pharmacy. Tasha [name] stated she helps pt with her medications and that the pt saw her PCP a week ago.  Obtained copy of [place] insurance card. Copy is on chart.       Discharge Plan:  Acute Rehab Placement/Return (not psych) (code 58)      The patient will  continue to be evaluated for developing discharge needs.     Case Manager: Arbie Cookey, [place]  Phone: [phone]

## 2018-02-04 NOTE — Progress Notes (Signed)
[facility]  NEUROSURGERY   PROGRESS NOTE    Tasha, [name], 84 y.o. [name]  Date of Admission:  02/03/2018  Date of Service: 02/04/2018  Date of Birth:  Feb 17, 1928    Chief Complaint: syncopal fall    Subjective:   No acute events overnight.    Vital Signs:  Temp (24hrs) Max:38.3 C (100.9 F)      Systolic (24hrs), Avg:142 , Min:119 , Max:160     Diastolic (24hrs), Avg:74, Min:59, Max:108    Temp  Avg: 38 C (100.4 F)  Min: 37.8 C (100 F)  Max: 38.3 C (100.9 F)  MAP (Non-Invasive)  Avg: 92.7 mmHG  Min: 77 mmHG  Max: 114 mmHG  Pulse  Avg: 105.2  Min: 94  Max: 124  Resp  Avg: 18.7  Min: 12  Max: 28  SpO2  Avg: 96.8 %  Min: 93 %  Max: 100 %  Pain Score (Numeric, Faces): 0    Min/Max/Avg ICP/CPP last 24hrs:   No data recorded    Today's Physical Exam:    Appears stated age, NAD  A&Ox3  GCS 4 6 5     Fluent speech  Diminished fund of knowledge  Appropriate attention span & concentration  Diminished recent and remote memory    CN 2 PERRL  CN 3 4 6  EOMI  CN 5 V1-V3 sensation intact  CN 7 Face symmetric  CN 8 Hearing grossly intact  CN 11 shrug symmetric  CN 12 Tongue midline    Muscle Strength 5/5 BUE and BLE  Muscle tone WNL  SILT BUE and BLE  No drift   Plantars downgoing (no Babinski)  No clonus    -- TTP in the cervical spine  -- Aspen cervical collar in place    Assessment/ Plan:  Tasha [name] is an 84 yo F PMH dementia, CAD, CVA, HTN s/p syncopal fall w/ a small R convexity SDH. PTD1.  -- No acute neurosurgical intervention at this time   -- Neurologic exam remains stable   -- Repeat CT-brain demonstrates stable hemorrhage  -- SBP goals <160 mmHg  -- Na goal eunatremia  -- Keppra 250 mg 2x/day x 7 days, end-date 02/10/18  -- No anti-coagulant or anti-platelet medications   -- Ok for DVT chemoprophylaxis Sunday 02/06/18   -- Hold Aspirin until NSGY follow-up in outpatient setting    -- Imaging:    -- CT-brain wo (02/03/18): PRELIM stable   -- CT-brain wo (02/03/18): small R convexity SDH w/ small quantity  of associated R frontal SAH; no significant mass effect   -- CT-C/A/P (02/03/18): no acute injury   -- CT-cervical IG (02/03/18): no acute injury  -- Pain/spasm control: Tylenol PRN  -- Diet: regular  -- Bowel regimen, last BM PTA  -- Abx: Fosfomycin x 3 doses for complicated UTI (history of UTI), end-date 02/08/18   -- FU Ur Cx (UA positive nitrite, large leuks, cloudy; 02/03/18): in process   -- Received one-time Ertapenem in ED  -- Activity: ambulate as tolerated w/ assist   -- Aspen cervical collar at all times (unable to clear due to TTP)  -- PT/OT: ordered   -- DVT ppx: SCDs/Venodynes  -- Consults:    Trauma - tertiary exam  -- Lines/Drains: none  -- Wound: none  -- Disposition: ongoing    Azeem A. Karilyn Cota, M.D.  PGY-3 Neurosurgery  02/04/2018, 06:48     ADDENDUM:    -- CT brain stable on repeat imaging  -- No  neurosurgical intervention  -- No anti-coagulant or anti-platelet medications   -- Ok for DVT chemoprophylaxis Sunday 02/06/18   -- Hold Aspirin until NSGY follow-up in outpatient setting  -- Keppra 250 mg 2x/day x 7 days, end-date 02/10/18  -- FU w/ Dr. Rod HollerSedney in 4 weeks w/ CT brain: ordered  -- Will sign off at this time. Please call with questions    Levan HurstJesse Lawrence, MD  PGY-2 Neurosurgery  02/04/2018       I saw and examined the patient.  I reviewed the resident's note.  I agree with the findings and plan of care as documented in the resident's note.  Any exceptions/additions are edited/noted.    Anice Paganiniara Karl Knarr, MD

## 2018-02-04 NOTE — Nurses Notes (Signed)
Pt being transferred to 1022.  Voice care report recorded.  Receiving RN with no questions.  Pt left unit via bed.

## 2018-02-05 LAB — CBC WITH DIFF
BASOPHIL #: 0.03 x10ˆ3/uL (ref 0.00–0.20)
BASOPHIL %: 1 %
EOSINOPHIL #: 0.03 x10ˆ3/uL (ref 0.00–0.50)
EOSINOPHIL %: 1 %
HCT: 35.1 % (ref 33.5–45.2)
HGB: 11.7 g/dL (ref 11.2–15.2)
LYMPHOCYTE #: 0.82 x10ˆ3/uL — ABNORMAL LOW (ref 1.00–4.80)
LYMPHOCYTE %: 14 %
MCH: 30.3 pg (ref 27.4–33.0)
MCHC: 33.3 g/dL (ref 32.5–35.8)
MCV: 91 fL (ref 78.0–100.0)
MONOCYTE #: 0.58 x10ˆ3/uL (ref 0.30–1.00)
MONOCYTE %: 10 %
MPV: 10.1 fL (ref 7.5–11.5)
NEUTROPHIL #: 4.45 x10ˆ3/uL (ref 1.50–7.70)
NEUTROPHIL %: 75 %
PLATELETS: 200 x10ˆ3/uL (ref 140–450)
RBC: 3.86 10*6/uL (ref 3.63–4.92)
RDW: 13.9 % (ref 12.0–15.0)
WBC: 5.9 x10ˆ3/uL (ref 3.5–11.0)

## 2018-02-05 LAB — BASIC METABOLIC PANEL
ANION GAP: 9 mmol/L (ref 4–13)
BUN/CREA RATIO: 23 — ABNORMAL HIGH (ref 6–22)
BUN: 25 mg/dL (ref 8–25)
CALCIUM: 8.4 mg/dL — ABNORMAL LOW (ref 8.5–10.2)
CHLORIDE: 104 mmol/L (ref 96–111)
CO2 TOTAL: 23 mmol/L (ref 22–32)
CREATININE: 1.08 mg/dL (ref 0.49–1.10)
ESTIMATED GFR: 48 mL/min/1.73mˆ2 — ABNORMAL LOW (ref >59)
GLUCOSE: 90 mg/dL (ref 65–139)
POTASSIUM: 4.1 mmol/L (ref 3.5–5.1)
SODIUM: 136 mmol/L (ref 136–145)

## 2018-02-05 LAB — MAGNESIUM: MAGNESIUM: 1.5 mg/dL — ABNORMAL LOW (ref 1.6–2.5)

## 2018-02-05 LAB — PHOSPHORUS: PHOSPHORUS: 2.8 mg/dL (ref 2.3–4.0)

## 2018-02-05 MED ORDER — MAGNESIUM SULFATE 2 GRAM/50 ML (4 %) IN WATER INTRAVENOUS PIGGYBACK
2.0000 g | INJECTION | Freq: Once | INTRAVENOUS | Status: AC
Start: 2018-02-05 — End: 2018-02-05
  Administered 2018-02-05 (×2): 0 g via INTRAVENOUS
  Administered 2018-02-05: 2 g via INTRAVENOUS
  Filled 2018-02-05: qty 50

## 2018-02-05 MED ORDER — ALBUTEROL SULFATE 2.5 MG/3 ML (0.083 %) SOLUTION FOR NEBULIZATION
2.5000 mg | INHALATION_SOLUTION | RESPIRATORY_TRACT | Status: DC | PRN
Start: 2018-02-05 — End: 2018-02-09

## 2018-02-05 MED ADMIN — sodium chloride 0.9 % intravenous solution: OPHTHALMIC | @ 22:00:00 | NDC 00338004904

## 2018-02-05 MED ADMIN — dakin's 0.25%: ORAL | @ 09:00:00 | NDC 09991000248

## 2018-02-05 NOTE — Progress Notes (Addendum)
[facility]  Medicine Progress Note  No CPR    Tasha [name]  Date of service: 02/05/2018    Subjective: No acute events overnight. Grandaughter and later daughter and son-in-law visted today. Pt with poor po intake which is not new per family. Generalized weakness. Has 6/10 generalized HA today which is not new. No nausea, vomiting, or chest pain. No other pertinent positive ROS.    Vital Signs:  Temp (24hrs) Max:37.4 C (99.3 F)      Systolic (24hrs), Avg:142 , Min:121 , Max:168     Diastolic (24hrs), Avg:74, Min:65, Max:80    Temp  Avg: 37.1 C (98.8 F)  Min: 36.9 C (98.4 F)  Max: 37.4 C (99.3 F)  MAP (Non-Invasive)  Avg: 94.8 mmHG  Min: 88 mmHG  Max: 110 mmHG  Pulse  Avg: 90.2  Min: 85  Max: 93  Resp  Avg: 16  Min: 14  Max: 18  SpO2  Avg: 95 %  Min: 94 %  Max: 96 %  Pain Score (Numeric, Faces): 9  Fi02    I/O:  I/O last 24 hours:      Intake/Output Summary (Last 24 hours) at 02/05/2018 1529  Last data filed at 02/05/2018 1400  Gross per 24 hour   Intake 1740 ml   Output --   Net 1740 ml     I/O current shift:  02/23 0700 - 02/23 1859  In: 575 [I.V.:575]  Out: -       Current Facility-Administered Medications:  acetaminophen (TYLENOL) tablet 650 mg Oral Q4H PRN   albuterol (PROVENTIL) 2.5 mg / 3 mL (0.083%) neb solution 2.5 mg Nebulization Q4H PRN   atenolol (TENORMIN) tablet 50 mg Oral Daily   cholestyramine-aspartame (PREVALITE) packet 4 g Oral Daily with Dinner   docusate sodium (COLACE) capsule 100 mg Oral 2x/day   fosfomycin (MONUROL) 3 g oral packet 3 g Oral EVERY OTHER DAY   levETIRAcetam (KEPPRA) tablet 250 mg Oral 2x/day   levothyroxine (SYNTHROID) tablet 125 mcg Oral QAM   memantine (NAMENDA) tablet 10 mg Oral 2x/day   mirtazapine (REMERON) tablet 30 mg Oral NIGHTLY   NS flush syringe 2 mL Intracatheter Q8HRS   And      NS flush syringe 2-6 mL Intracatheter Q1 MIN PRN   NS premix infusion  Intravenous Continuous   ondansetron (ZOFRAN) 2 mg/mL injection 4 mg Intravenous Q6H PRN    sennosides-docusate sodium (SENOKOT-S) 8.6-50mg  per tablet 1 Tab Oral Daily   travoprost (TRAVATAN) 0.004% ophthalmic solution 1 Drop Both Eyes NIGHTLY       Allergies   Allergen Reactions   . Furadantin [Nitrofurantoin]    . Morphine        Physical Exam:    I have reviewed all vital signs.    General- alert, NAD, appears chronically ill   Head- AT/NC  ENT- mucous membranes moist, no thrush  Eyes- pupils equal and round, sclera nonicteric  Heart- RRR, no murmurs, gallops or rubs  Lungs- CTAB with good air exchange  Abdomen- soft, NT/ND, BS normal  Skin- warm and dry, no rashes  Extremities- no cyanosis or edema  MSK- no gross deformities or obvious signs of atrophy  Neurologic- AOx3, CN 2-12 grossly intact, no gross focal deficits aside from generalized weakness  Psychiatric- speech normal, affect appropriate    Labs:  I have reviewed all lab results.  Lab Results Today:    Results for orders placed or performed during the hospital encounter of 02/03/18 (  from the past 24 hour(s))   BASIC METABOLIC PANEL   Result Value Ref Range    SODIUM 136 136 - 145 mmol/L    POTASSIUM 4.1 3.5 - 5.1 mmol/L    CHLORIDE 104 96 - 111 mmol/L    CO2 TOTAL 23 22 - 32 mmol/L    ANION GAP 9 4 - 13 mmol/L    CALCIUM 8.4 (L) 8.5 - 10.2 mg/dL    GLUCOSE 90 65 - 161 mg/dL    BUN 25 8 - 25 mg/dL    CREATININE 0.96 0.45 - 1.10 mg/dL    BUN/CREA RATIO 23 (H) 6 - 22    ESTIMATED GFR 48 (L) >59 mL/min/1.101m^2   MAGNESIUM   Result Value Ref Range    MAGNESIUM 1.5 (L) 1.6 - 2.5 mg/dL   PHOSPHORUS   Result Value Ref Range    PHOSPHORUS 2.8 2.3 - 4.0 mg/dL   CBC WITH DIFF   Result Value Ref Range    WBC 5.9 3.5 - 11.0 x10^3/uL    RBC 3.86 3.63 - 4.92 x10^6/uL    HGB 11.7 11.2 - 15.2 g/dL    HCT 40.9 81.1 - 91.4 %    MCV 91.0 78.0 - 100.0 fL    MCH 30.3 27.4 - 33.0 pg    MCHC 33.3 32.5 - 35.8 g/dL    RDW 78.2 95.6 - 21.3 %    PLATELETS 200 140 - 450 x10^3/uL    MPV 10.1 7.5 - 11.5 fL    NEUTROPHIL % 75 %    LYMPHOCYTE % 14 %    MONOCYTE % 10 %     EOSINOPHIL % 1 %    BASOPHIL % 1 %    NEUTROPHIL # 4.45 1.50 - 7.70 x10^3/uL    LYMPHOCYTE # 0.82 (L) 1.00 - 4.80 x10^3/uL    MONOCYTE # 0.58 0.30 - 1.00 x10^3/uL    EOSINOPHIL # 0.03 0.00 - 0.50 x10^3/uL    BASOPHIL # 0.03 0.00 - 0.20 x10^3/uL       Radiology:      I have reviewed all imaging.    Recent Results (from the past 720 hour(s))   ED [place] FAST EXAM                   Status: None    Narrative    Emergency Department Procedure:    ED [place] FAST EXAM                Date of Service:  Feb 03, 2018  2:06 PM    Indication:  Trauma     Findings as follows:  Heart:   Pericardial Effusion -  NOT VISUALIZED    Cardiac Activity -  PRESENT, NOT Severely Depressed    Abdomen:  RUQ -    NEGATIVE for free fluid.        LUQ -    NEGATIVE for free fluid.    Pelvis -   NEGATIVE for free fluid.    Chest:   Right pneumothorax:  NEGATIVE    Left pneumothorax:   NEGATIVE    Right pleural fluid:  NEGATIVE    Left pleural fluid:  NEGATIVE      Impression      NEGATIVE within limits and scope of exam.    Assisted by: none    Supervising Physician:    Minardi    Comments:       Everett Graff, MD, 02/04/2018 20:17    I  was physically present for the key portions of obtaining the [place] images,   have personally reviewed and interpreted the images and agree with the   findings as documented.    Everett Graff, MD          XR CHEST AP MOBILE     Status: None    Narrative    Tasha [name]  Female, 84 years old.    XR AP MOBILE CHEST performed on 02/03/2018 2:14 PM.    REASON FOR EXAM:  Chest Trauma    TECHNIQUE: 1 views/1 images submitted for interpretation.    COMPARISON:  None      Impression    There is eventration of the right hemidiaphragm. No consolidation or  pneumothorax or gross displaced fracture seen. There is some right hilar  prominence which likely represents vessels.     XR PELVIS     Status: None    Narrative    Tasha [name]  Female, 84 years old.    XR PELVIS performed on 02/03/2018 2:14 PM.    REASON FOR EXAM:  Pelvic  Injury    TECHNIQUE: 1 views/1 images submitted for interpretation.    COMPARISON:  None    FINDINGS:  No acute fracture or dislocation. Pelvic ring is intact. There  is soft tissue swelling in the right thigh. Sacrum is obscured by bowel  gas.      Impression    No acute osseous abnormality.     CT BRAIN WO IV CONTRAST     Status: None    Narrative    Tasha [name]  Female, 84 years old.    CT BRAIN WO IV CONTRAST performed on 02/03/2018 2:41 PM.    REASON FOR EXAM:  Head Trauma    RADIATION DOSE: 1358.70 mGycm    COMPARISON: None    FINDINGS:  The images demonstrate a right cerebral convexity subdural  hematoma measuring about 6 mm in maximal thickness. There is no significant  mass effect. No midline shift or herniation seen. This also small quantity  of subarachnoid hemorrhage over the right frontal lobe. No fracture  identified. No significant soft tissue contusion or hematoma identified.  Paranasal sinuses are clear.      Impression    Small right cerebral convex to the subdural hematoma and small quantity of  right frontal subarachnoid hemorrhage. No significant intracranial mass  effect.     CT TRAUMA CHEST ABDOMEN PELVIS WO IV CONTRAST     Status: None    Narrative    Tasha [name]  Female, 84 years old.    CT TRAUMA CHEST ABDOMEN PELVIS WO IV CONTRAST performed on 02/03/2018 2:41  PM.    REASON FOR EXAM:  fall from standing. mid thoracic spinal tenderness. also  productive cough    RADIATION DOSE: 740.00 mGycm    COMPARISON: None    FINDINGS:  The lungs, there is chronic areas of atelectasis and scar  without contusion or pleural effusion or pneumothorax identified. No  mediastinal hematoma seen. In the abdomen, there is no extraluminal fluid  or air or abnormal fat stranding. Bones show mild demineralization and  degenerative change. There is chronic subluxation at L4-L5. There is a  Schmorl's node of the superior endplate of L2. No fracture of the  thoracolumbar spine identified. There is an old healed  sternal fracture. No  acute rib fracture seen. No acute pelvis or hip fracture      Impression    Chronic  age-related changes without acute traumatic injury identified.     CT BRAIN WO IV CONTRAST     Status: None    Narrative    CT BRAIN WO IV CONTRAST performed on 02/03/2018 10:51 PM    INDICATION: 84 years old Female; SDH    TECHNIQUE: Axial images of the brain from the vertex to the skull base with  reformatted coronal and sagittal images reconstructed with soft tissue and  bone algorithms.    RADIATION DOSE: 1228.30 mGycm    COMPARISON: CT brain dated 02/03/2018 at 1435    FINDINGS:   Unchanged 6 mm maximal thickness right lateral frontal convexity subdural  hematoma without significant mass effect, midline shift, or herniation.  Adjacent small volume subarachnoid hemorrhage in the lateral right frontal  lobe sulcus is unchanged to slightly less prominent. Redemonstration of  right occipital lobe encephalomalacia and right occipital horn ex vacuo  dilation, likely sequela of prior infarction. Scattered periventricular and  subcortical white matter lucencies likely represent chronic small vessel  ischemic disease. Gray-white differentiation is otherwise well preserved.    No skull fracture is seen. Scalp is unremarkable. Visualized paranasal  sinuses and mastoid air cells are well aerated. The frontal sinuses are  non-pneumatized. Bilateral pseudophakia.      Impression    Unchanged small right subdural hematoma and minimal adjacent subarachnoid  hemorrhage without evidence of new intracranial abnormality.         Microbiology:      Urine cx 02/03/18 50k ESBL E. coli    PT/OT: Yes    Consults: NSGY    Hardware (lines, foley's, tubes): PIV    Assessment/ Plan:   Active Hospital Problems    Diagnosis   . Small R SDH without MLS       Tasha [name] is an 84 year old female who was [facility] with a PMH of dementia, CAD, CVA, and HTN who was admitted s/p fall and found to have a small right  convexity SDH.     SAH/SDH s/p fall  - Per NSGY, no acute surgical intervention at this time.  - Aspen collar removed today (02/04/2018).  Cleared by trauma service.   - Repeat CT brain demonstrates stable hemorrhage.  - SBP goal <160, BP controlled  - continue Keppra 250 mg BID x 7 day for seizure PPX (end date 02/10/2018).  - Hold anti-coagulants or anti-platelets until seen by NSGY for follow up in the outpatient setting.  - OK to resume DVT chemoprophylaxis on Sunday, 02/06/2018.   - PT/OT consulted, suspect pt will need SNF  - Tylenol PRN HA    Possible syncope  - fall sounds more mechanical with pre-syncope per my d/w family today  - d/c telemetry as no arrhythmia with 2 day of monitor and does not sound arrhythmogenic in nature  - TTE without significant pathology    Suspect UTI - present on admission (h/o recurrent UTI with ESBL)  - [facility] (2/21[facility] growing >100,000 gram negative rods.  Will call their microbiology lab at [phone] to get final results tomorrow.     - Received a one time dose of Ertapenem in the ED  - repeat urine cx 50k, suspect pt is colonized.  - Started on fosfomycin x 3 doses today (end date 02/08/2018).     CAD/ HTN  - Continue atenolol 50 mg daily.    - HOLD aspirin.     Dementia  - AOx3 on 02/05/18  -  Continue Namenda 10 mg BID.   - consult speech and nutrition with MNT d/t poor oral intake  - gentle IVF d/t poor oral intake    Hypothyroidism  - Continue Synthroid 125 mcg daily.     Physical deconditioning  - PT/OT consulted.  Will likely need SNF    DVT/PE Prophylaxis: SCDs/ Venodynes/Impulse boots    Disposition Planning: Skilled Nursing Unit, pt medically stable for D/C    Dustin Folks, M.D.  02/05/2018 15:29  Assistant Professor of Medicine  Section of [facility] Medicine  [facility]  Pager # [phone]

## 2018-02-05 NOTE — Care Plan (Signed)
[facility]    [facility]  Medical Nutrition Therapy Screen Note                                      Date of Service: 02/05/2018    Reason for Note: Consult: poor PO intake    Reviewed patient status, diet order/TF/TPN, labs and medications.  Height: 160 cm (5' 2.99")   Weight: 62 kg (136 lb 11 oz) (02/04/18 1413)   Body mass index is 24.22 kg/m.    Brief Subjective:   MD consult for poor po intake. 84 y.o. White female who [facility] a witnessed ground level fall with a PMH of CAD, CVA, HTN, and diverticulitis. Patient had CT scan completed at [facility] which showed traumatic SAH/SDH with minimal midline shift. No documented po intake yet. Will order Ensure Enlive BID to promote po intake.      Nutrition related problems: Unable to determine at this time    Assessment: No assessment at this time    Monitor: Po status, Tolerance of diet and Upon physician consult    Plan/Intervention:     - Unit RD to assess pt on Monday.  - Continue regular diet/mechanical soft. SLP eval ordered on 2/22. Advance diet as appropriate per SLP recommendation.    - Add chocolate Ensure Enlive BID.   - Monitor and encourage po intake.  - Trend weights twice weekly.    Nelson ChimesMichelle Davies, Dietetic Intern    I reviewed the intern's note and agree with the findings and recommendations documented. Any exceptions/additions are edited/noted.  Vira BlancoChelsea Shawnmichael Parenteau, RDLD  02/05/2018, 09:35

## 2018-02-05 NOTE — Nurses Notes (Signed)
I have reviewed and agree with the assessment and documentation of Erica Dailey, [place] SN.

## 2018-02-05 NOTE — Nurses Notes (Signed)
Patient had liquid BM with dark specs.  Hemeprompt positive.  Service notified.  No need to continue testing stool since hemoglobin is stable.  Will continue to monitor.

## 2018-02-05 NOTE — Care Plan (Signed)
[facility]  Rehabilitation Services  Speech Therapy Initial Swallow Evaluation     Patient Name: Tasha [name]  Date of Birth: 05/13/1928  Height: Height: 160 cm (5' 2.99")  Weight: Weight: 62 kg (136 lb 11 oz)  Room/Bed: 22/A  Payor: MEDICARE / Plan: MEDICARE PART A AND B / Product Type: Medicare /      Assessment:  Functional Level At Time Of Eval: Mastication prolonged. No overt s/s aspiration. Oropharyngeal swallow WFL for puree consistency foods and regular consistency liquids. Per family, pt with poor PO intake for several days. Recommend dietary consult. Soft vocal quality. Minimal verbalizations.      SLP Diet Recommendation: thin liquids, puree  Aspiration Precautions: small sips/bites, maintain upright posture during/after eating for 30 mins  Signs/Symptoms of Aspiration Noted (Swallowing): none  Recommended Diagnostics: reassess via clinical swallow (noninstrumental exam)        Discharge Needs:  Discharge Disposition: skilled nursing facility (will need Justification of D/C recommendation if placement is required.)    JUSTIFICATION OF DISCHARGE RECOMMENDATION   Based on current diagnosis, functional performance prior to admission, and current functional performance, this patient requires continued SLP services in skilled nursing facility in order to achieve significant functional improvements for Speech and Language and Swallowing.      Plan:  To provide Speech Therapy services 2-3 times/wkfor duration ofuntil discharge.     The risks/benefits of therapy have been discussed with the patient/caregiver and he/she is in agreement with the established plan of care.         Subjective & Objective        02/05/18 1019   Therapist Pager   SLP Pager Boneta LucksJenny [phone]    Rehab Session   Document Type evaluation   Total SLP Minutes: 28   Patient Effort adequate   Symptoms Noted During/After Treatment none   General Information   Patient Profile Reviewed? yes   Onset of Illness/Injury or Date of Surgery  02/03/18   Referring Physician Med Hospitalist 5    Patient/Family/Caregiver Comments/Observations Family present (daughter and granddaughter)    General Observations of Patient Pt lethargic; eyes closed; upright in bed    Pertinent History of Current Functional Problem "Tasha [name] is a 84 y.o. White female who [facility] a witnessed ground level fall. Patient had CT scan completed at [facility] which showed traumatic SAH/SDH with minimal midline shift. Patient admitted to [facility] under NSGY service for further treatment and evaluation (per MD note)."    Mutuality/Individual Preferences   Anxieties, Fears or Concerns Family concerned about poor appetite.    Functional Status Prior   Communication 0 - understands/communicates without difficulty   Swallowing 0 - swallows foods/liquids without difficulty   Cognitive   Cognitive/Neuro/Behavioral WDL ex   Level of Consciousness lethargic   Arousal Level other (see comments)  (opens eyes when asked to )   Orientation oriented x 4   Speech other (see comments)  (soft vocal quality; limited verbalizations )   Mood/Behavior calm;cooperative   Oral Motor Structure and Function   Additional Documentation Oral Motor Structure/Functional Assessment (Group)   Oral Motor Structure/Functional Assessment   Dentition (Oral Motor) upper and lower dentures   Secretion Management (Oral Motor Assessment) WFL   Mucosal Quality (Oral Motor Assessment) good   Velar Elevation (Oral Motor) WFL   Volitional Swallow (Oral Motor Assessment) no issues initiating volitional swallow   Volitional Cough (Oral Motor Assessment) no issues initiating volitional cough   Oral Musculature (  Oral Motor Assessment) WFL   Non Instrumental/Clinical Swallow (NIS)   Additional Documentation Thin Liquid Trial (NIS) (Group);Semi-Solid Texture Trial (NIS) (Group);Solid Texture Trial (NIS) (Group);Puree Texture Trial (NIS) (Group)   Thin Liquid Trial (NIS)   Mode of Presentation, Thin Liquid (NIS) fed  by clinician;straw   Volume Presented in mL, Thin Liquid (NIS) patient controlled volumes;other (see comments)  (4 oz total )   Oral Phase Results, Thin Liquid (NIS) intact oral phase without signs of dysfunction   Pharyngeal Phase Results, Thin Liquid (NIS) safe swallow, no signs/symptoms of aspiration or penetration   Laryngeal Observations, Thin Liquid (NIS) laryngeal function intact, normal movement bilaterally   Comment, Thin Liquid (NIS) no overt s/s aspiration    Puree Texture Trial (NIS)   Mode of Presentation, Puree (NIS) fed by clinician;spoon   Volume Presented in mL, Puree (NIS) 3 mL;5 mL;other (see comments)  (2 oz applesauce total )   Oral Phase Results, Puree (NIS) intact oral phase without signs of dysfunction   Pharyngeal Phase Results, Puree (NIS) safe swallow, no signs/symptoms of aspiration or penetration   Laryngeal Observations, Puree (NIS) laryngeal function intact, normal movement bilaterally   Comment, Puree (NIS) no overt s/s aspiration    Semi-Solid Texture Trial (NIS)   Mode of Presentation, Semi-Solid (NIS) fed by clinician;spoon   Volume Presented in mL, Semi-Solid (NIS) other (see comments)  (1 diced pear )   Oral Phase Results, Semi-Solid (NIS) impaired oral phase, signs of dysfunction present   Oral Transit Concerns, Semi-Solid (NIS) oral transit delayed, more than 5 seconds   Oral Preparation Concerns, Semi-Solid (NIS) excess chewing noted   Pharyngeal Phase Results, Semi-Solid (NIS) impaired pharyngeal phase of swallowing   Laryngeal Observations, Semi-Solid (NIS) reduction in laryngeal movement noted   Solid Texture Trial (NIS)   Mode of Presentation, Solid Food (NIS) fed by clinician   Volume Presented in mL, Solid Food (NIS) patient controlled volumes;other (see comments)  (1 small piece of graham crackers )   Oral Phase Results, Solid Food (NIS) impaired oral phase, signs of dysfunction present   Oral Transit Concerns, Solid Food (NIS) oral transit delayed, more than 5 seconds     Oral Preparation Concerns, Solid Food (NIS) excess chewing noted   Pharyngeal Phase Results, Solid Food (NIS) impaired pharyngeal phase of swallowing   Laryngeal Observations, Solid Food (NIS) reduction in laryngeal movement noted   Swallowing Clinical Impression   Patient's Goals For Discharge return home   Rehab Potential/Prognosis (Swallow Eval) re-evaluate goals as necessary   Functional Level At Time Of Eval Mastication prolonged. No overt s/s aspiration. Oropharyngeal swallow WFL for puree consistency foods and regular consistency liquids. Per family, pt with poor PO intake for several days. Recommend dietary consult. Soft vocal quality. Minimal verbalizations.    Therapy Frequency 2-3 times/wk   Predicted Duration Therapy Interv (days) until discharge   Expected Duration Therapy Session - minutes 15-30 minutes   SLP Diet Recommendation thin liquids;puree   Recommended Diagnostics reassess via clinical swallow (noninstrumental exam)   Recommended Feeding/Eating Techniques (Swallow Eval) small sips/bites;maintain upright posture during/after eating for 30 mins   Signs/Symptoms of Aspiration Noted (Swallowing) none   Anticipated Discharge Disposition skilled nursing facility   Plan of care reviewed with: RN;PT;MD;Family   Dysphagia Goals, SLP   Date Established (Dysphagia Goal, SLP) 02/04/18   Time Frame (Dysphagia Goal, SLP) by discharge   Dysphagia Goal, Oral Nutritional Level Goal no signs/symptoms of aspiration present;safely tolerates recommended diet texture;safely tolerates recommended liquid  viscosity;safely tolerates/maintains adequate oral nutrition        Therapist:  Waymon Amato, SLP   Pager #: [phone]  Phone #: [phone]

## 2018-02-05 NOTE — Care Plan (Signed)
Problem: Adult Inpatient Plan of Care  Goal: Absence of Hospital-Acquired Illness or Injury  Outcome: Ongoing (see interventions/notes)     Problem: Fall Injury Risk  Goal: Absence of Fall and Fall-Related Injury  Outcome: Ongoing (see interventions/notes)     Problem: Skin Injury Risk Increased  Goal: Skin Health and Integrity  Outcome: Ongoing (see interventions/notes)

## 2018-02-05 NOTE — Nurses Notes (Signed)
I have review and agree with the assessment and documentation of Tasha [name], Correll SN.

## 2018-02-06 ENCOUNTER — Encounter (HOSPITAL_COMMUNITY): Payer: Self-pay

## 2018-02-06 ENCOUNTER — Inpatient Hospital Stay (HOSPITAL_COMMUNITY): Payer: Medicare Other | Admitting: Radiology

## 2018-02-06 DIAGNOSIS — R05 Cough: Secondary | ICD-10-CM

## 2018-02-06 DIAGNOSIS — R51 Headache: Secondary | ICD-10-CM

## 2018-02-06 LAB — BASIC METABOLIC PANEL
ANION GAP: 12 mmol/L (ref 4–13)
BUN/CREA RATIO: 27 — ABNORMAL HIGH (ref 6–22)
BUN: 24 mg/dL (ref 8–25)
CALCIUM: 8.4 mg/dL — ABNORMAL LOW (ref 8.5–10.2)
CHLORIDE: 104 mmol/L (ref 96–111)
CO2 TOTAL: 17 mmol/L — ABNORMAL LOW (ref 22–32)
CREATININE: 0.9 mg/dL (ref 0.49–1.10)
ESTIMATED GFR: 59 mL/min/1.73m?2 — ABNORMAL LOW (ref >59)
ESTIMATED GFR: 59 mL/min/1.73mˆ2 — ABNORMAL LOW (ref >59)
GLUCOSE: 75 mg/dL (ref 65–139)
GLUCOSE: 75 mg/dL (ref 65–139)
POTASSIUM: 3.6 mmol/L (ref 3.5–5.1)
SODIUM: 133 mmol/L — ABNORMAL LOW (ref 136–145)

## 2018-02-06 LAB — CBC WITH DIFF
BASOPHIL #: 0.03 x10ˆ3/uL (ref 0.00–0.20)
BASOPHIL %: 0 %
EOSINOPHIL #: 0.05 x10ˆ3/uL (ref 0.00–0.50)
EOSINOPHIL %: 1 %
HCT: 35.2 % (ref 33.5–45.2)
HGB: 11.8 g/dL (ref 11.2–15.2)
LYMPHOCYTE #: 0.9 x10ˆ3/uL — ABNORMAL LOW (ref 1.00–4.80)
LYMPHOCYTE %: 15 %
MCH: 30.7 pg (ref 27.4–33.0)
MCHC: 33.6 g/dL (ref 32.5–35.8)
MCV: 91.4 fL (ref 78.0–100.0)
MONOCYTE #: 0.54 x10ˆ3/uL (ref 0.30–1.00)
MONOCYTE %: 9 %
MPV: 10 fL (ref 7.5–11.5)
NEUTROPHIL #: 4.37 x10ˆ3/uL (ref 1.50–7.70)
NEUTROPHIL %: 74 %
PLATELETS: 196 x10ˆ3/uL (ref 140–450)
RBC: 3.85 x10ˆ6/uL (ref 3.63–4.92)
RDW: 14.2 % (ref 12.0–15.0)
WBC: 5.9 x10ˆ3/uL (ref 3.5–11.0)

## 2018-02-06 LAB — MAGNESIUM: MAGNESIUM: 3.1 mg/dL — ABNORMAL HIGH (ref 1.6–2.5)

## 2018-02-06 LAB — PHOSPHORUS: PHOSPHORUS: 2.3 mg/dL (ref 2.3–4.0)

## 2018-02-06 LAB — URINE CULTURE: URINE CULTURE: 50000 — AB

## 2018-02-06 MED ORDER — CAFFEINE 200 MG TABLET
200.00 mg | ORAL_TABLET | Freq: Every day | ORAL | Status: DC | PRN
Start: 2018-02-06 — End: 2018-02-09
  Administered 2018-02-06 – 2018-02-07 (×2): 200 mg via ORAL
  Filled 2018-02-06 (×3): qty 1

## 2018-02-06 MED ORDER — ACETAMINOPHEN 325 MG TABLET
650.00 mg | ORAL_TABLET | ORAL | Status: DC | PRN
Start: 2018-02-06 — End: 2018-02-09
  Administered 2018-02-06 – 2018-02-09 (×2): 650 mg via ORAL
  Filled 2018-02-06 (×4): qty 2

## 2018-02-06 MED ORDER — POLYETHYLENE GLYCOL 3350 17 GRAM ORAL POWDER PACKET
17.0000 g | Freq: Three times a day (TID) | ORAL | Status: DC | PRN
Start: 2018-02-06 — End: 2018-02-07

## 2018-02-06 MED ORDER — PROCHLORPERAZINE EDISYLATE 10 MG/2 ML (5 MG/ML) INJECTION SOLUTION
10.0000 mg | Freq: Four times a day (QID) | INTRAMUSCULAR | Status: DC | PRN
Start: 2018-02-06 — End: 2018-02-06

## 2018-02-06 MED ORDER — MAGNESIUM SULFATE 2 GRAM/50 ML (4 %) IN WATER INTRAVENOUS PIGGYBACK
2.00 g | INJECTION | INTRAVENOUS | Status: AC
Start: 2018-02-06 — End: 2018-02-06
  Administered 2018-02-06: 0 g via INTRAVENOUS
  Administered 2018-02-06: 2 g via INTRAVENOUS
  Filled 2018-02-06: qty 50

## 2018-02-06 MED ORDER — PROCHLORPERAZINE EDISYLATE 10 MG/2 ML (5 MG/ML) INJECTION SOLUTION
5.0000 mg | Freq: Four times a day (QID) | INTRAMUSCULAR | Status: DC | PRN
Start: 2018-02-06 — End: 2018-02-09
  Administered 2018-02-07 – 2018-02-08 (×4): 5 mg via INTRAVENOUS
  Filled 2018-02-06 (×4): qty 2

## 2018-02-06 MED ORDER — PROCHLORPERAZINE EDISYLATE 10 MG/2 ML (5 MG/ML) INJECTION SOLUTION
10.00 mg | INTRAMUSCULAR | Status: AC
Start: 2018-02-06 — End: 2018-02-06
  Administered 2018-02-06: 10 mg via INTRAVENOUS
  Filled 2018-02-06: qty 2

## 2018-02-06 MED ORDER — DIPHENHYDRAMINE 25 MG CAPSULE
50.00 mg | ORAL_CAPSULE | ORAL | Status: AC
Start: 2018-02-06 — End: 2018-02-06
  Administered 2018-02-06: 50 mg via ORAL
  Filled 2018-02-06: qty 2

## 2018-02-06 MED ORDER — DIPHENHYDRAMINE 50 MG/ML INJECTION SOLUTION
12.5000 mg | Freq: Four times a day (QID) | INTRAMUSCULAR | Status: DC | PRN
Start: 2018-02-06 — End: 2018-02-09
  Administered 2018-02-06 – 2018-02-08 (×4): 12.5 mg via INTRAVENOUS
  Filled 2018-02-06 (×4): qty 1

## 2018-02-06 MED ORDER — DIPHENHYDRAMINE 25 MG CAPSULE
50.0000 mg | ORAL_CAPSULE | Freq: Four times a day (QID) | ORAL | Status: DC | PRN
Start: 2018-02-06 — End: 2018-02-06

## 2018-02-06 MED ADMIN — lactated Ringers intravenous solution: ORAL | @ 08:00:00 | NDC 00264775000

## 2018-02-06 MED ADMIN — amLODIPine 5 mg tablet: ORAL | @ 08:00:00

## 2018-02-06 MED ADMIN — atenoloL 50 mg tablet: ORAL | @ 08:00:00

## 2018-02-06 MED ADMIN — memantine 10 mg tablet: ORAL | @ 21:00:00

## 2018-02-06 MED ADMIN — prochlorperazine Edisylate 10 mg/2 mL (5 mg/mL) injection solution: INTRAVENOUS | @ 04:00:00

## 2018-02-06 MED ADMIN — sodium chloride 0.9 % intravenous solution: ORAL | @ 06:00:00 | NDC 00338004904

## 2018-02-06 MED ADMIN — sodium chloride 0.9 % intravenous solution: ORAL | @ 04:00:00

## 2018-02-06 NOTE — Care Plan (Signed)
Patient is alert and oriented x4. Complains of head ache pain. Headache cocktail given. NS running 75 an hour. Sitter select on for patient safety. No other needs at this time. Call light within reach.

## 2018-02-06 NOTE — Progress Notes (Addendum)
[facility]  Medicine Progress Note  No CPR    Tasha [name]  Date of service: 02/06/2018    Subjective: Tasha [name] and Tasha [name] at bedside. Pt more alert this morning and interactive. Has cough that is minimally productive but rattles. No chest pain. No nausea or vomiting. No diarrhea. Tasha [name] reports pt with h/o recurrent UTI's and treated in past with carbapenems. She also has a h/o C diff from her recurrent abx exposure. Pt denies dysuria or urinary urgency. Tasha [name] reports she ate pretty well this morning. Does have 7/10 HA. Received benadryl, compazine and Mag IV for HA overnight with improvement.    Vital Signs:  Temp (24hrs) Max:37.3 C (99.1 F)      Systolic (24hrs), Avg:151 , Min:124 , Max:165     Diastolic (24hrs), Avg:70, Min:64, Max:79    Temp  Avg: 37.1 C (98.7 F)  Min: 36.7 C (98.1 F)  Max: 37.3 C (99.1 F)  MAP (Non-Invasive)  Avg: 97 mmHG  Min: 88 mmHG  Max: 106 mmHG  Pulse  Avg: 90.5  Min: 85  Max: 105  Resp  Avg: 16.3  Min: 14  Max: 18  SpO2  Avg: 95.3 %  Min: 95 %  Max: 96 %  Pain Score (Numeric, Faces): 10  Fi02    I/O:  I/O last 24 hours:      Intake/Output Summary (Last 24 hours) at 02/06/2018 0859  Last data filed at 02/06/2018 0600  Gross per 24 hour   Intake 1575 ml   Output --   Net 1575 ml     I/O current shift:  No intake/output data recorded.      Current Facility-Administered Medications:  acetaminophen (TYLENOL) tablet 650 mg Oral Q4H PRN   albuterol (PROVENTIL) 2.5 mg / 3 mL (0.083%) neb solution 2.5 mg Nebulization Q4H PRN   atenolol (TENORMIN) tablet 50 mg Oral Daily   cholestyramine-aspartame (PREVALITE) packet 4 g Oral Daily with Dinner   docusate sodium (COLACE) capsule 100 mg Oral 2x/day   fosfomycin (MONUROL) 3 g oral packet 3 g Oral EVERY OTHER DAY   levETIRAcetam (KEPPRA) tablet 250 mg Oral 2x/day   levothyroxine (SYNTHROID) tablet 125 mcg Oral QAM   memantine (NAMENDA) tablet 10 mg Oral 2x/day   mirtazapine (REMERON) tablet 30 mg Oral NIGHTLY   NS flush  syringe 2 mL Intracatheter Q8HRS   And      NS flush syringe 2-6 mL Intracatheter Q1 MIN PRN   NS premix infusion  Intravenous Continuous   ondansetron (ZOFRAN) 2 mg/mL injection 4 mg Intravenous Q6H PRN   sennosides-docusate sodium (SENOKOT-S) 8.6-50mg  per tablet 1 Tab Oral Daily   travoprost (TRAVATAN) 0.004% ophthalmic solution 1 Drop Both Eyes NIGHTLY       Allergies   Allergen Reactions   . Furadantin [Nitrofurantoin]    . Morphine        Physical Exam:    I have reviewed all vital signs.    General- alert, NAD, appears chronically ill but more responsive today  Head- AT/NC  ENT- mucous membranes moist, no thrush  Eyes- pupils equal and round, sclera nonicteric  Heart- RRR, no murmur appreciated  Lungs- few scattered rhonchi with transmitted upper airway sounds, no crackles  Abdomen- soft, NT/ND, BS normal  Skin- warm and dry, no rashes  Extremities- no cyanosis or edema  MSK- no gross deformities or obvious signs of atrophy  Neurologic- AOx3, CN 2-12 grossly intact, no gross focal deficits aside from generalized weakness  Psychiatric- speech normal, affect appropriate    Labs:  I have reviewed all lab results.  Lab Results Today:    Results for orders placed or performed during the hospital encounter of 02/03/18 (from the past 24 hour(s))   BASIC METABOLIC PANEL   Result Value Ref Range    SODIUM 133 (L) 136 - 145 mmol/L    POTASSIUM 3.6 3.5 - 5.1 mmol/L    CHLORIDE 104 96 - 111 mmol/L    CO2 TOTAL 17 (L) 22 - 32 mmol/L    ANION GAP 12 4 - 13 mmol/L    CALCIUM 8.4 (L) 8.5 - 10.2 mg/dL    GLUCOSE 75 65 - 295 mg/dL    BUN 24 8 - 25 mg/dL    CREATININE 2.84 1.32 - 1.10 mg/dL    BUN/CREA RATIO 27 (H) 6 - 22    ESTIMATED GFR 59 (L) >59 mL/min/1.25m^2   MAGNESIUM   Result Value Ref Range    MAGNESIUM 3.1 (H) 1.6 - 2.5 mg/dL   PHOSPHORUS   Result Value Ref Range    PHOSPHORUS 2.3 2.3 - 4.0 mg/dL   CBC WITH DIFF   Result Value Ref Range    WBC 5.9 3.5 - 11.0 x10^3/uL    RBC 3.85 3.63 - 4.92 x10^6/uL    HGB 11.8 11.2 -  15.2 g/dL    HCT 44.0 10.2 - 72.5 %    MCV 91.4 78.0 - 100.0 fL    MCH 30.7 27.4 - 33.0 pg    MCHC 33.6 32.5 - 35.8 g/dL    RDW 36.6 44.0 - 34.7 %    PLATELETS 196 140 - 450 x10^3/uL    MPV 10.0 7.5 - 11.5 fL    NEUTROPHIL % 74 %    LYMPHOCYTE % 15 %    MONOCYTE % 9 %    EOSINOPHIL % 1 %    BASOPHIL % 0 %    NEUTROPHIL # 4.37 1.50 - 7.70 x10^3/uL    LYMPHOCYTE # 0.90 (L) 1.00 - 4.80 x10^3/uL    MONOCYTE # 0.54 0.30 - 1.00 x10^3/uL    EOSINOPHIL # 0.05 0.00 - 0.50 x10^3/uL    BASOPHIL # 0.03 0.00 - 0.20 x10^3/uL       Radiology:      I have reviewed all imaging.       Microbiology:      Urine cx 02/03/18 50k ESBL E. coli    PT/OT: Yes    Consults: NSGY    Hardware (lines, foley's, tubes): PIV    Assessment/ Plan:   Active Hospital Problems    Diagnosis   . Small R SDH without MLS       Tasha [name] is an 84 year old female who was transferred from [facility] with a PMH of dementia, CAD, CVA, and HTN who was admitted s/p fall and found to have a small right convexity SDH.     SAH/SDH s/p mechanical fall  - Per NSGY, no acute surgical intervention at this time.  - Aspen collar removed today (02/04/2018).  Cleared by trauma service.   - Repeat CT brain demonstrates stable hemorrhage.  - fall sounds more mechanical with pre-syncope per my d/w family   - TTE without significant pathology and telemetry without arrhythmia  - SBP goal <160, BP controlled  - continue Keppra 250 mg BID x 7 day for seizure PPX (end date 02/10/2018).  - Hold anti-coagulants or anti-platelets until seen by NSGY for follow up in  the outpatient setting.  - OK to resume DVT chemoprophylaxis on Sunday, 02/06/2018, however will hold chemoprophylaxis for SCD's for now  - PT/OT consulted, suspect pt will need SNF but awaiting evaluation which will likely be tomorrow     Headache--- worsening  -d/t above  -add Benadryl + Compazine IV 6H PRN headache as this has worsened and lingering today  -add Caffeine tablet PRN HA as well  -Tylenol PRN  HA  -avoid additional Mag as hypermagnesemic this morning    Cough--- worsening  -no frank signs of PNA, suspect weakness with poor cough  -percussion/vibration via bed for pulmonary toilet  -incentive spirometry  -stop IVF  -CXR obtained today 02/06/18 reviewed by me and my interpretation is no frank consolidation of PNA, no frank pulmonary edema    ESBL UTI - present on admission (h/o recurrent UTI with ESBL)  - [facility] (2/21[facility] growing >100,000 gram negative rods.  Will call their microbiology lab at [phone] to get final results tomorrow.     - Received a one time dose of Ertapenem in the ED  - repeat urine cx 50k, suspect pt is colonized.  - complete fosfomycin x 3 doses (end date 02/08/2018)    CAD/ HTN  - Continue atenolol 50 mg daily.    - discontinued aspirin.     Dementia  - AOx3 on 02/05/18  - Continue Namenda 10 mg BID.   - consult speech and nutrition with MNT d/t poor oral intake  - stop IVF    Hypothyroidism  - Continue Synthroid 125 mcg daily.     Physical deconditioning  - PT/OT consulted.  Will likely need SNF    DVT/PE Prophylaxis: SCDs/ Venodynes/Impulse boots    Disposition Planning: Skilled Nursing Unit, pt medically stable for D/C    Dustin FolksJason W. Tyechia Allmendinger, M.D.  02/06/2018 08:59  Assistant Professor of Medicine  Section of [facility]  [facility]  Pager # [phone]

## 2018-02-07 ENCOUNTER — Inpatient Hospital Stay (HOSPITAL_COMMUNITY): Payer: Medicare Other | Admitting: Radiology

## 2018-02-07 DIAGNOSIS — M4312 Spondylolisthesis, cervical region: Secondary | ICD-10-CM

## 2018-02-07 DIAGNOSIS — S0990XA Unspecified injury of head, initial encounter: Secondary | ICD-10-CM

## 2018-02-07 DIAGNOSIS — I6200 Nontraumatic subdural hemorrhage, unspecified: Secondary | ICD-10-CM

## 2018-02-07 DIAGNOSIS — M4692 Unspecified inflammatory spondylopathy, cervical region: Secondary | ICD-10-CM

## 2018-02-07 DIAGNOSIS — R197 Diarrhea, unspecified: Secondary | ICD-10-CM

## 2018-02-07 DIAGNOSIS — M542 Cervicalgia: Secondary | ICD-10-CM

## 2018-02-07 DIAGNOSIS — W19XXXA Unspecified fall, initial encounter: Secondary | ICD-10-CM

## 2018-02-07 LAB — BASIC METABOLIC PANEL
ANION GAP: 11 mmol/L (ref 4–13)
BUN/CREA RATIO: 22 (ref 6–22)
BUN: 20 mg/dL (ref 8–25)
CALCIUM: 8.6 mg/dL (ref 8.5–10.2)
CHLORIDE: 103 mmol/L (ref 96–111)
CO2 TOTAL: 21 mmol/L — ABNORMAL LOW (ref 22–32)
CREATININE: 0.9 mg/dL (ref 0.49–1.10)
ESTIMATED GFR: 59 mL/min/1.73mˆ2 — ABNORMAL LOW (ref >59)
GLUCOSE: 94 mg/dL (ref 65–139)
POTASSIUM: 3.5 mmol/L (ref 3.5–5.1)
SODIUM: 135 mmol/L — ABNORMAL LOW (ref 136–145)

## 2018-02-07 LAB — MAGNESIUM: MAGNESIUM: 1.7 mg/dL (ref 1.6–2.5)

## 2018-02-07 MED ORDER — NYSTATIN 100,000 UNIT/GRAM TOPICAL POWDER
Freq: Three times a day (TID) | CUTANEOUS | Status: DC | PRN
Start: 2018-02-07 — End: 2018-02-09
  Filled 2018-02-07: qty 15

## 2018-02-07 MED ORDER — LANOLIN-OXYQUIN-PET, HYDROPHIL TOPICAL OINTMENT
TOPICAL_OINTMENT | Freq: Three times a day (TID) | CUTANEOUS | Status: DC | PRN
Start: 2018-02-07 — End: 2018-02-09
  Filled 2018-02-07: qty 1

## 2018-02-07 MED ORDER — BANANA FLAKES-TRANSGALACTOOLIGOSACCHARIDE ORAL POWDER PACKET
1.00 | Freq: Three times a day (TID) | ORAL | Status: DC | PRN
Start: 2018-02-07 — End: 2018-02-09

## 2018-02-07 MED ADMIN — sodium chloride 0.9 % (flush) injection syringe: @ 06:00:00

## 2018-02-07 MED ADMIN — lidocaine HCL 2 % mucosal jelly: ORAL | @ 21:00:00

## 2018-02-07 NOTE — Care Plan (Signed)
Spoke with service and pt medically stable for discharge. Received a [facility] [place] [name] [phone]) who stated that pt's daughter, Tasha MccreedyBarbara, had contacted her regarding placement for her mother. [name] stated that currently they do not have any beds available and would need referral information. MSW stated she would check with pt and family before referral would be made. MSW spoke with pt, her MPOA Tasha [name](Tasha [name]) and her husband Tasha [name](Tasha [name]) about SNF choices. Verbal CHOICE was provided for: [name], Tasha [name], Tasha [name]. Referrals were sent for all three places. PAS was signed.

## 2018-02-07 NOTE — Nurses Notes (Signed)
[id number] results back, patient asking to remove aspen.

## 2018-02-07 NOTE — Nurses Notes (Signed)
Called to room 1022 patient found on floor.  Stated she had a bowel movement and was trying to clean herself up, stood up and fell.  SS in place, did not go off. Stated she hit back of her head.  Vitals HR 116 BP 127/76 O2 97%. Service paged. Aspen collar ordered. Patient cleaned up, aspen collar applied, patient back to bed via backboard.  Service at bedside to assess. CT ordered. Patient on tram 10W2    Call to family

## 2018-02-07 NOTE — Nurses Notes (Signed)
[id number] Grotz Family at bedside asking to please speak with someone.  Thanks.

## 2018-02-07 NOTE — Nurses Notes (Signed)
1030-pt's grandson called nurse's station after speaking to pt. Grandson informed of pt. fall, CT scan, and aspen collar. Lucila MaineGrandson states he will inform MPOA.

## 2018-02-07 NOTE — Care Plan (Signed)
[facility]  Rehabilitation Services  Speech Therapy Progress Note    Patient Name: Tasha HesselbachLola P [name]  Date of Birth: 02/12/1928  Weight:  62 kg (136 lb 11 oz)  Room/Bed: 22/A  Payor: MEDICARE / Plan: MEDICARE PART A AND B / Product Type: Medicare /      Date/Time of Admission: 02/03/2018  2:03 PM  Admitting Diagnosis:  Subdural hematoma (CMS HCC) [S06.5X9A]        Assessment:  No overt s/s of aspiration with thin liquids and mechanical soft solids.   JUSTIFICATION OF DISCHARGE RECOMMENDATION  Based on current diagnosis, functional performance prior to admission, and current  functional performance, this patient requires continued Speech services   in Skilled Nursing Facility in order to achieve significant functional improvements in these   deficit areas: Cognition, Speech and Language and Swallowing.      Plan:  Recommendations: Upgrade solids to mechanical soft solids and continue thin liquids. Strict adherence to aspiration precautions.    Results & Recommendations Discussed With:Patient and Nurse   Continue to follow patient according to established plan of care.  The risks/benefits of therapy have been discussed with the patient/caregiver and he/she is in agreement with the established plan of care.       Subjective:  Flowsheet Info:     02/07/18 1118   Therapist Pager   SLP Pager Daveah Varone [phone]   Rehab Session   Total SLP Minutes: 13   Mutuality/Individual Preferences   Anxieties, Fears or Concerns Prefers to wears dentures when eating   Pt resting in bed. Agreeable to swallow therapy.     Objective:  HOB raised and bottom denture rinsed and placed in mouth. Top denture already in place. Oral cavity moist and oral care deferred. Pt accepted pears via spoon and demonstrated functional ability to strip food from spoon, functional mastication, and good AP transit. No residue in O-C after swallow. Pt drank water and chocolate ensure via straw without difficulty. Good oral control and no overt s/s of aspiration.        Therapist:  Avis EpleyAngie Kamsiyochukwu Buist, SLP   Pager #: [phone]  Phone #: [phone]  Treatment Time: 13 minutes

## 2018-02-07 NOTE — Care Plan (Signed)
Medical Nutrition Therapy Assessment        SUBJECTIVE : Met with pt and her family at bedside.  Pt attributes poor po intake to excessive diarrhea, she is begin tested for c-diff. Diet recently upgrade from puree to mechanical soft.  Pt typically eats 2 meals/day plus and snack and drinks 2 Ensure Enlive per day.  Family report a 10 lb weight loss over the last month or so, claiming her current weight to be closer to 121 lbs.  Of note, pt does not like chocolate or eggs.     OBJECTIVE:     Current Diet Order/Nutrition Support:  MNT PROTOCOL FOR DIETITIAN  DIETARY ORAL SUPPLEMENTS Oral Supplements with tray: Ensure Enlive-Chocolate; BREAKFAST/DINNER; 1 Can  ROOM SERVICE:  SEND AUTOMATIC HOUSE TRAY  DIET REGULAR Consistency/thickening: MECHANICAL SOFT (Ground meat/Soft foods); Instructional/informational: NEEDS ASSISTANCE WITH ALL MEALS     Height Used for Calculations: 160 cm (5' 2.99")  Weight Used For Calculations: 62 kg (136 lb 11 oz)  BMI (kg/m2): 24.27  BMI Assessment: BMI 18.5-24.9: normal  Usual Body Weight: 59 kg (130 lb)  Weight Loss: 4.536 kg (10 lb)(per family)    Estimated Needs:    Energy Calorie Requirements: 1550-1850 calories per day (25-30 kcals/62 kg BW)  Protein Requirements (gms/day): 53-63 grams per day (1-1.2 g/52.7 kg IBW)  Fluid Requirements: 1550-1850 mls per day (25-30 mLs/62 kg)    Comments: -     Recommend :   Continue Regular Diet, consistency per SLP  Adjust flavor of supplement to strawberry, increase frequency to TID  Provide assistance/encouragement with meals  Weigh pt 2x/week  Consider banana flakes if diarrhea persists    Will follow    Nutrition Diagnosis: Involuntary weight loss related to Pathophysiological  Altered absorption or metabolism as evidenced by excessive diarrhea and self reported 10 lb weight loss    Carmie Kanner, RDLD  02/07/2018, 13:43      Pager # [phone]

## 2018-02-07 NOTE — Nurses Notes (Signed)
[id number] Shiel- fell states she hit her head

## 2018-02-07 NOTE — Progress Notes (Addendum)
[facility]  Medicine Progress Note  No CPR    Shiza Fanny DanceP [place]  Date of service: 02/07/2018    Subjective: Paged this morning that pt fell and found on floor by nursing staff. Pt reports she had diarrheal bowel movement and was trying to clean herself and the floor when she lost her balance and fell striking her head. She reports frontal HA that is 8/10 and neck pain characterized as aching sensation. No focal numbnness or weakness reported. Care maintained to keep cervical spine immobilized. Aspen collar applied. CT brain and CT cervical spine ordered stat. Spoke with Trauma Surgery to evaluate. Had multiple diarrheal bowel movements since yesterday. No fever overnight. Cough has improved.    Vital Signs:  Temp (24hrs) Max:37.5 C (99.5 F)      Systolic (24hrs), Avg:146 , Min:129 , Max:161     Diastolic (24hrs), Avg:77, Min:67, Max:82    Temp  Avg: 37.2 C (98.9 F)  Min: 36.6 C (97.9 F)  Max: 37.5 C (99.5 F)  MAP (Non-Invasive)  Avg: 101.3 mmHG  Min: 93 mmHG  Max: 107 mmHG  Pulse  Avg: 86.3  Min: 80  Max: 97  Resp  Avg: 17.2  Min: 16  Max: 18  SpO2  Avg: 94.8 %  Min: 94 %  Max: 95 %  Pain Score (Numeric, Faces): 7  Fi02    I/O:  I/O last 24 hours:      Intake/Output Summary (Last 24 hours) at 02/07/2018 0759  Last data filed at 02/07/2018 0400  Gross per 24 hour   Intake 1095 ml   Output --   Net 1095 ml     I/O current shift:  No intake/output data recorded.      Current Facility-Administered Medications:  acetaminophen (TYLENOL) tablet 650 mg Oral Q4H PRN   albuterol (PROVENTIL) 2.5 mg / 3 mL (0.083%) neb solution 2.5 mg Nebulization Q4H PRN   atenolol (TENORMIN) tablet 50 mg Oral Daily   caffeine tablet 200 mg Oral Daily PRN   cholestyramine-aspartame (PREVALITE) packet 4 g Oral Daily with Dinner   diphenhydrAMINE (BENADRYL) 50 mg/mL injection 12.5 mg Intravenous Q6H PRN   And      prochlorperazine (COMPAZINE) 5 mg/mL injection 5 mg Intravenous Q6H PRN   fosfomycin (MONUROL) 3 g oral packet 3 g Oral  EVERY OTHER DAY   levETIRAcetam (KEPPRA) tablet 250 mg Oral 2x/day   levothyroxine (SYNTHROID) tablet 125 mcg Oral QAM   memantine (NAMENDA) tablet 10 mg Oral 2x/day   mirtazapine (REMERON) tablet 30 mg Oral NIGHTLY   NS flush syringe 2 mL Intracatheter Q8HRS   And      NS flush syringe 2-6 mL Intracatheter Q1 MIN PRN   ondansetron (ZOFRAN) 2 mg/mL injection 4 mg Intravenous Q6H PRN   travoprost (TRAVATAN) 0.004% ophthalmic solution 1 Drop Both Eyes NIGHTLY       Allergies   Allergen Reactions   . Furadantin [Nitrofurantoin]    . Morphine        Physical Exam:    I have reviewed all vital signs.    General- alert, NAD, appears chronically ill, examined on floor prior to movement then in bed once on backboard  Head- AT/NC  ENT- mucous membranes moist, no thrush  Eyes- PERRLA, sclera nonicteric  Heart- RRR, no murmur appreciated  Lungs- CTAB, no wheezing or crackles  Abdomen- soft, non-tender, non-distended, BS normoactive  Skin- warm and dry, no rashes  Extremities- no cyanosis or edema  MSK- no  gross deformities or obvious signs of atrophy, TTP mid cervical spine, no spinal tenderness in thoracic or lumbosacral region  Neurologic- AOx3, CN 2-12 grossly intact, sensation normal, strength symmetric throughout, no gross focal deficits   Psychiatric- speech normal, affect appropriate    Labs:  I have reviewed all lab results.  Lab Results Today:    Results for orders placed or performed during the hospital encounter of 02/03/18 (from the past 24 hour(s))   BASIC METABOLIC PANEL   Result Value Ref Range    SODIUM 135 (L) 136 - 145 mmol/L    POTASSIUM 3.5 3.5 - 5.1 mmol/L    CHLORIDE 103 96 - 111 mmol/L    CO2 TOTAL 21 (L) 22 - 32 mmol/L    ANION GAP 11 4 - 13 mmol/L    CALCIUM 8.6 8.5 - 10.2 mg/dL    GLUCOSE 94 65 - 161 mg/dL    BUN 20 8 - 25 mg/dL    CREATININE 0.96 0.45 - 1.10 mg/dL    BUN/CREA RATIO 22 6 - 22    ESTIMATED GFR 59 (L) >59 mL/min/1.31m^2   MAGNESIUM   Result Value Ref Range    MAGNESIUM 1.7 1.6 - 2.5  mg/dL       Radiology:      I have reviewed all imaging.       Microbiology:      Urine cx 02/03/18 50k ESBL E. coli    PT/OT: Yes    Consults: NSGY    Hardware (lines, foley's, tubes): PIV    Assessment/ Plan:   Active Hospital Problems    Diagnosis   . Small R SDH without MLS       Ariea P Adney is an 84 year old female who was transferred from [facility] with a PMH of dementia, CAD, CVA, and HTN who was admitted s/p fall and found to have a small right convexity SDH.     Mechanical fall on 02/07/18  -lost balance and fell this morning  -cervical spine immobilized, Aspen collar placed, transferred from floor to bed with back board  -ordered stat CT brain and CT cervical spine  -continue neuro checks and fall precautions  -asked nurse to place on video monitoring to further prevent falls as sitter select reportedly did not function  -spoke with Trauma service for evaluation, appreciate assistance    Headache  -d/t above with worsening 2/2 fall on 02/07/18  -continue Benadryl + Compazine IV 6H PRN headache   -continue Caffeine tablet PRN HA as well  -Tylenol PRN HA    Diarrhea  -6 bowel movements yesterday  -stop senokot daily  -if continues to have diarrhea will send for C. Diff testing as pt with history of this per daughter  -banana flakes TID PRN diarrhea    SAH/SDH s/p mechanical fall (prior to admission)  - Per NSGY, no acute surgical intervention at this time.  - Aspen collar removed today (02/04/2018).  Cleared by trauma service.   - Repeat CT brain demonstrates stable hemorrhage.  - fall sounds more mechanical with pre-syncope per my d/w family   - TTE without significant pathology and telemetry without arrhythmia  - SBP goal <160, BP controlled  - continue Keppra 250 mg BID x 7 day for seizure PPX (end date 02/10/2018).  - Hold anti-coagulants or anti-platelets until seen by NSGY for follow up in the outpatient setting.  - OK to resume DVT chemoprophylaxis on Sunday, 02/06/2018, however will hold  chemoprophylaxis and use SCD's  for now especially d/t above  - PT/OT consulted, suspect pt will need SNF but awaiting evaluation which will likely be tomorrow     Cough--- improved  -no frank signs of PNA, suspect weakness with poor cough  -percussion/vibration via bed for pulmonary toilet  -incentive spirometry  -CXR without acute process    ESBL UTI - present on admission (h/o recurrent UTI with ESBL)  - UCx (2/21) from [facility] growing >100,000 gram negative rods.  Will call their microbiology lab at [phone] to get final results tomorrow.     - Received a one time dose of Ertapenem in the ED  - repeat urine cx 50k, suspect pt is colonized.  - complete fosfomycin x 3 doses (end date 02/08/2018)    CAD/ HTN  -continue atenolol 50 mg daily.    -discontinued aspirin.     Dementia  -AOx3   -continue Namenda 10 mg BID.   -speech and nutrition with MNT d/t poor oral intake    Hypothyroidism  -ordered TSH  -continue Synthroid 125 mcg daily.     Physical deconditioning  - PT/OT consulted.  Will likely need SNF    DVT/PE Prophylaxis: SCDs/ Venodynes/Impulse boots    Disposition Planning: Skilled Nursing Unit, not medically stable for D/C today    Dustin Folks, M.D.  02/07/2018 07:59  Assistant Professor of Medicine  Section of [facility] Medicine  [facility]  Pager # [phone]

## 2018-02-07 NOTE — Care Plan (Signed)
Patient with fall today without injury.  CT complete, family aware.  SS and VM in place for safety

## 2018-02-07 NOTE — Care Management Notes (Signed)
[facility]  Care Management Note    Patient Name: Tasha [name]  Date of Birth: 01/08/1928  Sex: female  Date/Time of Admission: 02/03/2018  2:03 PM  Room/Bed: 22/A  Payor: MEDICARE / Plan: MEDICARE PART A AND B / Product Type: Medicare /    LOS: 4 days   Primary Care Providers:  Rolm BookbinderHare, Julie D, MD, MD (General)    Admitting Diagnosis:  Subdural hematoma ([facility]) [Z61.0R6E][S06.5X9A]    Assessment:      02/07/18 1644   Assessment Details   Assessment Type Continued Assessment   Date of Care Management Update 02/07/18   Date of Next DCP Update 02/10/18   Care Management Plan   Discharge Planning Status plan in progress   Projected Discharge Date 02/08/18   CM will evaluate for rehabilitation potential yes   Patient choice offered to patient/family yes   Facility or Agency Preferences [facility], [facility], [facility]   Patient aware of possible cost for ambulance transport?  Yes   Discharge Needs Assessment   Discharge Facility/Level of Care Needs SNF Placement (Medicare certified)(code 3)     Spoke with service and pt medically stable for discharge. [place] [facility] [place] [facility] [phone]) who stated that pt's daughter, Tasha MccreedyBarbara, had contacted her regarding placement for her mother. [name] stated that currently they do not have any beds available and would need referral information. MSW stated she would check with pt and family before referral would be made. MSW spoke with pt, her MPOA Tasha Mccreedy(Barbara) and her husband Tasha Hill(Donald) about SNF choices. Verbal CHOICE was provided for: [facility], [facility], [facility]. Referrals were sent for all three places. PAS was signed.         Discharge Plan:  SNF Placement (Medicare certified) (code 3)      The patient will continue to be evaluated for developing discharge needs.     Case Manager: Rocky LinkJessica Natally Ribera, SOCIAL WORKER  Phone: [phone]

## 2018-02-07 NOTE — Care Plan (Signed)
Patient is alert and oriented x4. Complains of head pain. PRN medication given. No needs at this time. Call light within reach.

## 2018-02-07 NOTE — Consults (Signed)
[facility]  Trauma History & Physical    Holden, Draughon, 84 y.o. female  Date of Birth:  September 04, 1928  Encounter Start Date:  02/03/2018  Inpatient Admission Date:  02/03/2018  Date of Service:  02/07/2018    Attending Physician: Jake Church, MD      HPI: (must include no less than 4 of the following main descriptors) Location (of pain): Quality (character of pain) Severity (minimal, mild, severe, scale or 1-10) Duration (how long has pain/sx present) Timing (when does pain/sx occur)  Context (activity at/before onset) Modifying Factors (what makes pain/sx  Better/worse) Associate Sign/Sx (what accompanies main pain/sx)   Patient is an 84 yo female that reports she had severe diarrhea this am and was bending over to clean herself up and she fell out of bed. She reports hitting her head but denies LOC. Patient currently denies any pain and would like her cervical collar off. Patient denies any paraesthesias. Trauma was consulted for the fall , tertiary exam and collar clearance.      Pre-operative Risk Assessment   Not a pre-operative H&P  PAST MEDICAL/ FAMILY/ SOCIAL HISTORY:       Past Medical History:   Diagnosis Date   . Coronary artery disease    . CVA (cerebrovascular accident) (CMS HCC)    . Diverticulitis    . Hypertension          Medications Prior to Admission     Prescriptions    aspirin (ECOTRIN) 81 mg Oral Tablet, Delayed Release (E.C.)    Take 81 mg by mouth Once a day    atenolol (TENORMIN) 50 mg Oral Tablet    Take 50 mg by mouth Once a day    calcium carbonate 500 mg calcium (1,250 mg) Oral Tablet    Take 500 mg by mouth Once a day    Cholecalciferol, Vitamin D3, (VITAMIN D-3) 5,000 unit Oral Tablet    Take by mouth Every 7 days    cholestyramine-aspartame (PREVALITE) 4 gram Oral Powder in Packet    Take 4 g by mouth Every evening with dinner    estradiol (ESTRACE) 0.01 % (0.1 mg/gram) Vaginal Cream    2 g by Vaginal route Every Monday, Wednesday and Friday for 90 days    gabapentin (NEURONTIN)  100 mg Oral Capsule    Take 100 mg by mouth    L.acid/L.casei/B.bif/B.lon/FOS (PROBIOTIC BLEND ORAL)    Take by mouth    latanoprost (XALATAN) 0.005 % Ophthalmic Drops    Instill 1 Drop into both eyes Every evening    levothyroxine (SYNTHROID) 125 mcg Oral Tablet    Take 125 mcg by mouth Every morning    magnesium oxide (MAG-OX) 400 mg (241.3 mg magnesium) Oral Tablet    Take 400 mg by mouth Twice daily    memantine (NAMENDA) 10 mg Oral Tablet    Take 10 mg by mouth Twice daily    mirabegron (MYRBETRIQ) 50 mg Oral Tablet Sustained Release 24 hr    Take 1 Tab (50 mg total) by mouth Once a day for 30 days    mirtazapine (REMERON) 30 mg Oral Tablet    Take 30 mg by mouth Every night    oxyCODONE-acetaminophen (PERCOCET) 7.5-325 mg Oral Tablet    Take 1 Tab by mouth Every 4 hours as needed for Pain         Allergies   Allergen Reactions   . Furadantin [Nitrofurantoin]    . Morphine  Past Surgical History:   Procedure Laterality Date   . BREAST CYST EXCISION Bilateral    . EYE SURGERY     . HIP SURGERY     . HX CHOLECYSTECTOMY     . HX HYSTERECTOMY           Social History     Tobacco Use   . Smoking status: Never Smoker   . Smokeless tobacco: Never Used   Substance Use Topics   . Alcohol use: Never     Frequency: Never   . Drug use: Not on file     Family History:    Family Medical History:     Problem Relation (Age of Onset)    No Known Problems Mother, Father          ROS:  MUST comment on all "Abnormal" findings   ROS Other than ROS in the HPI, all other systems were negative.      PHYSICAL EXAMINATION: MUST comment on all "Abnormal" findings    Initial Vitals: Temperature: 38 C (100.4 F)  BP (Non-Invasive): (!) 148/78  MAP (Non-Invasive): 108 mmHG  Heart Rate: (!) 109  Respiratory Rate: 18  Pain Score (Numeric, Faces): 8  SpO2-1: 93 %  Exam Temperature: 36.6 C (97.9 F)  Heart Rate: 97  BP (Non-Invasive): 115/65  Respiratory Rate: 18  SpO2-1: 94 %  Pain Score (Numeric, Faces): 10  GEN:   NAD  HEENT:    Normocephalic; atraumatic pupils equal, round and reactive to light; extraocular movements are intact.  Conjunctivae pink, nasal mucosa normal, mucous membranes moist.  No malocclusion.    NECK:   Non-tender to palpation, full ROM without pain or neurologic changes and Collar discontinued  PULM:   Lung sounds clear to auscultation bilaterally.  Normal respiratory effort.  No wheezes, rales or rhonchi.    ABD:   Abdomen soft, nontender, and nondistended.  Back:  No evidence of injury, Non-tender to midline palpation and no step-off  MS: Atraumatic.  Distal pulses intact.  Normal strength and range of motion of all extremities.    NEURO:   Alert and oriented to person, place and time.    Glasgow Coma Scale: Eye opening: 4 spontaneous, Verbal resonse:  5 oriented, Best motor response:  6 obeys commands  Coma (less than 8): Coma -Not applicable    DIAGNOSTIC STUDIES: Comment on "Positives"   Labs:  I have reviewed all lab results.  Lab Results Today:    Results for orders placed or performed during the hospital encounter of 02/03/18 (from the past 24 hour(s))   BASIC METABOLIC PANEL   Result Value Ref Range    SODIUM 135 (L) 136 - 145 mmol/L    POTASSIUM 3.5 3.5 - 5.1 mmol/L    CHLORIDE 103 96 - 111 mmol/L    CO2 TOTAL 21 (L) 22 - 32 mmol/L    ANION GAP 11 4 - 13 mmol/L    CALCIUM 8.6 8.5 - 10.2 mg/dL    GLUCOSE 94 65 - 454139 mg/dL    BUN 20 8 - 25 mg/dL    CREATININE 0.980.90 1.190.49 - 1.10 mg/dL    BUN/CREA RATIO 22 6 - 22    ESTIMATED GFR 59 (L) >59 mL/min/1.5127m^2   MAGNESIUM   Result Value Ref Range    MAGNESIUM 1.7 1.6 - 2.5 mg/dL       Radiology:   CT brain positive stable SDH  CT Cspine negative      Impression & Recommendations:  Recommendations:   Fall this am - CT brain and CT Cspine Negative for new injuries   Tertiary exam negative   Collar cleared   Will sign off, please call with questions   Patient's family had multiple questions about discharge, notified primary team of concerns.     Kendal Hymen,  APRN,NP-C          Trauma/Surgical Critical Care/Acute Care Surgery Staff  Late entry for 02/07/18.  I saw and examined the patient.  I reviewed the findings and documentation of the Nurse Practitioner .  I agree with the findings and plan of care as documented in the Nurse Practitioner note.  Any exceptions/additions are edited/noted.    Maryellen Pile, MD  Assistant Professor of Surgery  Trauma, Surgical Critical Care, and Acute Care Surgery  [facility]  02/15/2018, 11:01

## 2018-02-07 NOTE — Nurses Notes (Signed)
0800-attempted to call family to inform them of pt. Fall. No answer at this time. Will re-attempt.

## 2018-02-08 LAB — THYROID STIMULATING HORMONE WITH FREE T4 REFLEX: TSH: 1.735 u[IU]/mL (ref 0.350–5.000)

## 2018-02-08 MED ADMIN — lactated Ringers intravenous solution: @ 21:00:00 | NDC 00338011704

## 2018-02-08 MED ADMIN — sodium hypochlorite 0.0125 % topical solution: ORAL | @ 10:00:00 | NDC 00436066916

## 2018-02-08 MED ADMIN — memantine 10 mg tablet: ORAL | @ 20:00:00

## 2018-02-08 NOTE — Progress Notes (Signed)
[name]  Medicine Progress Note  No CPR    Tasha Fanny DanceP Coburn  Date of service: 02/08/2018    Subjective: No acute events overnight. Did have loose BM this morning. HA responsive to migraine cocktail. No nausea, abdominal pain, or vomiting. No cough or dyspnea.     Vital Signs:  Temp (24hrs) Max:37.7 C (99.9 F)      Systolic (24hrs), Avg:123 , Min:105 , Max:144     Diastolic (24hrs), Avg:70, Min:57, Max:80    Temp  Avg: 37.1 C (98.7 F)  Min: 36.5 C (97.7 F)  Max: 37.7 C (99.9 F)  MAP (Non-Invasive)  Avg: 87.7 mmHG  Min: 73 mmHG  Max: 102 mmHG  Pulse  Avg: 91  Min: 84  Max: 97  Resp  Avg: 17.5  Min: 16  Max: 19  SpO2  Avg: 94.7 %  Min: 94 %  Max: 95 %  Pain Score (Numeric, Faces): 3  Fi02    I/O:  I/O last 24 hours:      Intake/Output Summary (Last 24 hours) at 02/08/2018 0728  Last data filed at 02/08/2018 0147  Gross per 24 hour   Intake 260 ml   Output --   Net 260 ml     I/O current shift:  No intake/output data recorded.      Current Facility-Administered Medications:  acetaminophen (TYLENOL) tablet 650 mg Oral Q4H PRN   albuterol (PROVENTIL) 2.5 mg / 3 mL (0.083%) neb solution 2.5 mg Nebulization Q4H PRN   atenolol (TENORMIN) tablet 50 mg Oral Daily   banana flakes (BANATROL PLUS) packet 1 Packet Oral 3x/day PRN   caffeine tablet 200 mg Oral Daily PRN   diphenhydrAMINE (BENADRYL) 50 mg/mL injection 12.5 mg Intravenous Q6H PRN   And      prochlorperazine (COMPAZINE) 5 mg/mL injection 5 mg Intravenous Q6H PRN   fosfomycin (MONUROL) 3 g oral packet 3 g Oral EVERY OTHER DAY   lanolin-oxyquin-pet, hydrophil (BAG BALM) topical ointment  Apply Topically 3x/day PRN   levETIRAcetam (KEPPRA) tablet 250 mg Oral 2x/day   levothyroxine (SYNTHROID) tablet 125 mcg Oral QAM   memantine (NAMENDA) tablet 10 mg Oral 2x/day   mirtazapine (REMERON) tablet 30 mg Oral NIGHTLY   NS flush syringe 2 mL Intracatheter Q8HRS   And      NS flush syringe 2-6 mL Intracatheter Q1 MIN PRN   nystatin (NYSTOP) 100,000 units/g  topical powder  Apply Topically 3x/day PRN   ondansetron (ZOFRAN) 2 mg/mL injection 4 mg Intravenous Q6H PRN   travoprost (TRAVATAN) 0.004% ophthalmic solution 1 Drop Both Eyes NIGHTLY       Allergies   Allergen Reactions   . Furadantin [Nitrofurantoin]    . Morphine        Physical Exam:    I have reviewed all vital signs.    General- alert, NAD  Head- AT/NC  ENT- mucous membranes moist, no thrush  Eyes- PERRLA, sclera nonicteric  Heart- RRR, no murmur appreciated  Lungs- CTAB, no wheezing or crackles  Abdomen- soft, non-tender, non-distended, BS normoactive  Skin- warm and dry, no rashes  Extremities- no cyanosis or edema  MSK- no gross deformities or obvious signs of atrophy, physical deconditioning  Neurologic- AOx3, CN 2-12 grossly intact, sensation and strength symmetric  Psychiatric- speech normal, affect appropriate    Labs:  I have reviewed all lab results.  Lab Results Today:    Results for orders placed or performed during the [name] encounter of 02/03/18 (from the  past 24 hour(s))   THYROID STIMULATING HORMONE WITH FREE T4 REFLEX   Result Value Ref Range    TSH 1.735 0.350 - 5.000 uIU/mL       Radiology:      I have reviewed all imaging.  Results for orders placed or performed during the [name] encounter of 02/03/18 (from the past 24 hour(s))   CT BRAIN WO IV CONTRAST     Status: None    Narrative    Tasha [name]  Female, 84 years old.    CT BRAIN WO IV CONTRAST performed on 02/07/2018 9:12 AM.    REASON FOR EXAM:  fall, head trauma    RADIATION DOSE: 1172.40 mGycm    TECHNIQUE: Noncontrast CT of the head.    COMPARISON: February 03, 2018.    FINDINGS:  The previously seen right convexity subdural hematoma is  unchanged. There is a tiny amount of adjacent subarachnoid hemorrhage which  is also unchanged. No new intracranial hemorrhage or extra axial fluid  collection is identified. No calvarial fractures seen. There is unchanged  periventricular white matter lucency. A remote right PCA territory  infarct  is unchanged as well.      Impression    Unchanged small right convexity subdural hematoma.     CT CERVICAL SPINE WO IV CONTRAST     Status: None    Narrative    Tasha [name]  Female, 84 years old.    CT CERVICAL SPINE WO IV CONTRAST performed on 02/07/2018 9:13 AM.    REASON FOR EXAM:  fall, neck pain    RADIATION DOSE: 469.30 mGycm    TECHNIQUE: Noncontrast CT of the cervical spine    COMPARISON: None available.    FINDINGS:  No cervical spinal fracture or traumatic subluxation is  identified. There is mild anterolisthesis of C4 on C5 likely related to  facet arthropathy. There is multilevel facet arthropathy, most advanced at  C3-C4 and C4-C5. There is moderate disc space height loss at C5-C6 and  C6-C7. No prevertebral soft tissue thickening is seen. Is biapical pleural  parenchymal scarring and thickening in the lungs. There is some  calcification at the ligamentum flavum at C7.        Impression     IMPRESSION:   No fracture traumatic malalignment is identified.         Microbiology:      Urine cx 02/03/18 50k ESBL E. coli    PT/OT: Yes    Consults: NSGY    Hardware (lines, foley's, tubes): PIV    Assessment/ Plan:   Active [name] Problems    Diagnosis   . Small R SDH without MLS       Tasha [name] is an 84 year old female who was transferred from [name] with a PMH of dementia, CAD, CVA, and HTN who was admitted s/p fall and found to have a small right convexity SDH.     SAH/SDH s/p mechanical fall   - Per NSGY, no acute surgical intervention at this time.  - Aspen collar removed today (02/04/2018).  Cleared by trauma service.   - Repeat CT brain demonstrates stable hemorrhage.  - fall sounds more mechanical with pre-syncope per my d/w family   - TTE without significant pathology and telemetry without arrhythmia  - SBP goal <160, BP controlled  - continue Keppra 250 mg BID x 7 day for seizure PPX (end date 02/10/2018).  - Hold anti-coagulants or anti-platelets until seen by NSGY for  follow up in the outpatient setting.  - OK to resume DVT chemoprophylaxis on Sunday, 02/06/2018, however will hold chemoprophylaxis and use SCD's for now especially d/t recent fall  - mechanical fall in the [name] on 02/07/18, CT brain and cervical spine stable, Aspen collar cleared/removed per Trauma Surgery  - PT/OT consulted, suspect pt will need SNF but awaiting evaluation which will likely be tomorrow     Headache  -d/t above   -continue Benadryl + Compazine IV 6H PRN headache   -continue Caffeine tablet PRN HA as well  -Tylenol PRN pain    Diarrhea--- improved  -diarrhea improved with stopping senokot  -C diff testing ordered today as family requesting d/t h/o C diff  -banana flakes TID PRN diarrhea    ESBL UTI - present on admission (h/o recurrent UTI with ESBL)  -UCx (2/21) from [facility] growing >100,000 gram negative rods.  Will call their microbiology lab at [phone] to get final results tomorrow.     -received a one time dose of Ertapenem in the ED  -repeat urine cx 50k, suspect pt is colonized.  -complete fosfomycin x 3 doses (end date 02/08/2018)    CAD/ HTN  -continue atenolol 50 mg daily.    -discontinued aspirin    Dementia  -AOx3   -continue Namenda 10 mg BID.   -speech and nutrition with MNT d/t poor oral intake    Hypothyroidism  -ordered TSH  -continue Synthroid 125 mcg daily.     Physical deconditioning  - PT/OT consulted.  Will likely need SNF    DVT/PE Prophylaxis: SCDs/ Venodynes/Impulse boots    Disposition Planning: Skilled Nursing Unit, medically stable for D/C pending C diff results    Dustin Folks, M.D.  02/08/2018 07:28  Assistant Professor of Medicine  Section of [place] [name]  [facility]  Pager # [phone]

## 2018-02-08 NOTE — Care Plan (Signed)
[facility]  Rehabilitation Services  Speech Therapy Progress Note    Patient Name: Tasha [name]  Date of Birth: 02/18/1928  Weight:  62 kg (136 lb 11 oz)  Room/Bed: 22/A  Payor: MEDICARE / Plan: MEDICARE PART A AND B / Product Type: Medicare /      Date/Time of Admission: 02/03/2018  2:03 PM  Admitting Diagnosis:  Subdural hematoma (CMS HCC) [S06.5X9A]        Assessment:  Adequate mastication with soft cookie; no overt signs of aspiration with thin liquids.       Plan:  Recommendations: Continue with mechanical soft diet and thin consistency liquids; please be sure pt is fully awake, alert and upright for all PO.    Results & Recommendations Discussed With:Patient and Nurse   Continue to follow patient according to established plan of care.  The risks/benefits of therapy have been discussed with the patient/caregiver and he/she is in agreement with the established plan of care.       Subjective:     02/08/18 1200   Therapist Pager   SLP Pager Itzel Mckibbin [phone]   Rehab Session   Document Type therapy progress note (daily note)   Total SLP Minutes: 13       Objective:  Pt sleeping; awakened with verbal/tactile stim.  Pt accepted a couple bites of soft cookie and drinks of thin AJ via straw.  Adequate mastication and good oral clearance.  No cough, throat clear or vocal change. Session ended as pt stating she needs to use bathroom.  Nursing notified.       Therapist:  Fuller CanadaStacy Sejla Marzano, SLP   Pager #: [phone]  Phone #: [phone]  Treatment Time: 13 minutes

## 2018-02-08 NOTE — Care Plan (Signed)
Patient OOB with assist x2. Tolerating a regular mechanical soft diet with nectar thick liquids. Voiding adequate amounts without difficulty. LBM 02/07/2018. Patient denies pain at this time. Plan is for patient to go to skilled nursing facility. Awaiting placement. Patient instructed to call out if help needed, call bell within reach. Sitter select and video monitoring on and functioning. Will continue to monitor patient.

## 2018-02-08 NOTE — Care Plan (Signed)
Problem: Adult Inpatient Plan of Care  Goal: Plan of Care Review  Outcome: Ongoing (see interventions/notes)  Goal: Patient-Specific Goal (Individualization)  Outcome: Ongoing (see interventions/notes)  Goal: Absence of Hospital-Acquired Illness or Injury  Outcome: Ongoing (see interventions/notes)  Goal: Optimal Comfort and Wellbeing  Outcome: Ongoing (see interventions/notes)  Goal: Rounds/Family Conference  Outcome: Ongoing (see interventions/notes)     Problem: Fall Injury Risk  Goal: Absence of Fall and Fall-Related Injury  Outcome: Ongoing (see interventions/notes)     Problem: Skin Injury Risk Increased  Goal: Skin Health and Integrity  Outcome: Ongoing (see interventions/notes)     Problem: Acute Rehab Services Goal & Intervention Plan  Goal: Bathing Goal  Description  Stand Alone Therapy Goal  Outcome: Ongoing (see interventions/notes)  Goal: Bed Mobility Goal  Description  Stand Alone Therapy Goal  Outcome: Ongoing (see interventions/notes)  Goal: Caregiver Training Goal  Description  Stand Alone Therapy Goal  Outcome: Ongoing (see interventions/notes)  Goal: Cognition Goal  Description  Stand Alone Therapy Goal  Outcome: Ongoing (see interventions/notes)  Goal: Cognition Goals, SLP  Description  Stand Alone Therapy Goal  Outcome: Ongoing (see interventions/notes)  Goal: Communication Goals, SLP  Description  Stand Alone Therapy Goal  Outcome: Ongoing (see interventions/notes)  Goal: Dysphagia Goals, SLP  Description  Stand Alone Therapy Goal  Outcome: Ongoing (see interventions/notes)  Goal: Eating Self-Feeding Goal  Description  Stand Alone Therapy Goal  Outcome: Ongoing (see interventions/notes)  Goal: Gait Training Goal  Description  Stand Alone Therapy Goal  Outcome: Ongoing (see interventions/notes)  Goal: Grooming Goal  Description  Stand Alone Therapy Goal  Outcome: Ongoing (see interventions/notes)  Goal: Home Management Goal  Description  Stand Alone Therapy Goal  Outcome: Ongoing (see  interventions/notes)  Goal: Interprofessional Goal  Description  Stand Alone Therapy Goal  Outcome: Ongoing (see interventions/notes)  Goal: LB Dressing Goal  Description  Stand Alone Therapy Goal  Outcome: Ongoing (see interventions/notes)  Goal: Occupational Therapy Goals  Description  Stand Alone Therapy Goal  Outcome: Ongoing (see interventions/notes)  Goal: Physical Therapy Goal  Description  Stand Alone Therapy Goal  Outcome: Ongoing (see interventions/notes)  Goal: Range of Motion Goal  Description  Stand Alone Therapy Goal  Outcome: Ongoing (see interventions/notes)  Goal: Strength Goal  Description  Stand Alone Therapy Goal  Outcome: Ongoing (see interventions/notes)  Goal: Toileting Goal  Description  Stand Alone Therapy Goal  Outcome: Ongoing (see interventions/notes)  Goal: Goal Transfer Training  Description  Stand Alone Therapy Goal  Outcome: Ongoing (see interventions/notes)  Goal: UB Dressing Goal  Description  Stand Alone Therapy Goal  Outcome: Ongoing (see interventions/notes)     Problem: Health Knowledge, Opportunity to Enhance (Adult,Obstetrics,Pediatric)  Goal: Knowledgeable about Health Subject/Topic  Description  Patient will demonstrate the desired outcomes by discharge/transition of care.  Outcome: Ongoing (see interventions/notes)

## 2018-02-08 NOTE — Care Management Notes (Signed)
[facility]  Care Management Note    Patient Name: Byrd HesselbachLola P Cuffe  Date of Birth: 08/08/1928  Sex: female  Date/Time of Admission: 02/03/2018  2:03 PM  Room/Bed: 22/A  Payor: MEDICARE / Plan: MEDICARE PART A AND B / Product Type: Medicare /    LOS: 5 days   Primary Care Providers:  Rolm BookbinderHare, Julie D, MD, MD (General)    Admitting Diagnosis:  [facility] hemat[facility] ([facility]) [U04.5W0J][S06.5X9A]    Assessment:      02/08/18 1632   Assessment Details   Assessment Type Continued Assessment   Date of Care Management Update 02/08/18   Date of Next DCP Update 02/11/18   Care Management Plan   Discharge Planning Status plan in progress   Projected Discharge Date 02/09/18   Patient aware of possible cost for ambulance transport?  Yes   Discharge Needs Assessment   Discharge Facility/Level of Care Needs SNF Placement (Medicare certified)(code 3)       Discharge Plan:  SNF Placement (Medicare certified) (code 3)  CCC received call back from SturgeonMike in admissions @ [facility] skilled unit.   They will be able to accept pt for admission on 2/27.   CCC spoke with pt daughter MPOA- Britta MccreedyBarbara to update.   Discussed ambulance transport and Medicare guidelines- Britta MccreedyBarbara believes they belong to ambulance service and is going to contact them to see if they will transport pt.   CCC advised we will contact her in am to complete d/c arrangements      The patient will continue to be evaluated for developing discharge needs.     Case Manager: Elder Loveebra Consuella Scurlock, RN  Phone: [phone]

## 2018-02-08 NOTE — Care Management Notes (Signed)
Received a call from [place] [facility] [phone]) who had several questions but then stated they would accept the pt and be able to admit her tomorrow before 4:00pm. Service was also made aware of discharge tomorrow.  Called and left message for [place], [place]. [phone]).       PAS was submitted and approved.

## 2018-02-08 NOTE — Care Plan (Signed)
[facility]  Rehabilitation Services  Physical Therapy Progress Note      Patient Name: Tasha [name]  Date of Birth: 02-28-1928  Height:  160 cm (5' 2.99")  Weight:  62 kg (136 lb 11 oz)  Room/Bed: 22/A  Payor: MEDICARE / Plan: MEDICARE PART A AND B / Product Type: Medicare /     Assessment:     Good tolerance to PT treatment this date. Pt able to increase ambulated distance with use of FWW. Limited d/t pain and dizziness. Continues to present with decreased activity tolerance, endurance and strength. Will follow.     Discharge Needs:   Equipment Recommendation: TBD    Discharge Disposition: skilled nursing facility    JUSTIFICATION OF DISCHARGE RECOMMENDATION   Based on current diagnosis, functional performance prior to admission, and current functional performance, this patient requires continued PT services in skilled nursing facility in order to achieve significant functional improvements in these deficit areas: aerobic capacity/endurance, ergonomics and body mechanics, gait, locomotion, and balance, muscle performance.      Plan:   Continue to follow patient according to established plan of care.  The risks/benefits of therapy have been discussed with the patient/caregiver and he/she is in agreement with the established plan of care.     Subjective & Objective:        02/08/18 0830   Therapist Pager   PT Assigned/ Pager # [phone]   Rehab Session   Document Type therapy progress note (daily note)   Total PT Minutes: 31   Patient Effort good   Symptoms Noted During/After Treatment dizziness   Symptoms Noted Comment with initial sitting and post gait; resolved with rest break   General Information   Patient Profile Reviewed? yes   Patient/Family/Caregiver Comments/Observations Pt seen bedside this date, supine in bed, RN approved session, seen with professional assist of OT   Medical Lines PIV Line;Telemetry   Respiratory Status room air   Existing Precautions/Restrictions contact isolation;DNR;fall  precautions   Mutuality/Individual Preferences   Individualized Care Needs OOB with 2 person assist and FWW; chair follow for ambulation   Pre Treatment Status   Pre Treatment Patient Status Patient supine in bed;Call light within reach;Telephone within reach;Sitter select activated;Nurse approved session   Support Present Pre Treatment  Family present   Communication Pre Treatment  Nurse   Pain Assessment   Pre/Post Treatment Pain Comment reports headache - did not rate   Bed Mobility Assessment/Treatment   Bed Mobility, Assistive Device bed rails;Head of Bed Elevated   Supine-Sit Independence verbal cues required;minimum assist (75% patient effort)   Safety Issues decreased use of arms for pushing/pulling;decreased use of legs for bridging/pushing;impaired trunk control for bed mobility   Impairments balance impaired;coordination impaired;endurance;flexibility decreased;pain;postural control impaired;ROM decreased;strength decreased   Comment assist provided for upper trunk   Transfer Assessment/Treatment   Sit-Stand Independence verbal cues required;minimum assist (75% patient effort);2 person assist required   Stand-Sit Independence verbal cues required;minimum assist (75% patient effort);2 person assist required   Sit-Stand-Sit, Assist Device walker, front wheeled   Bed-Chair Independence verbal cues required;minimum assist (75% patient effort);1 person + 1 person to manage equipment   Bed-Chair-Bed Assist Device walker, front wheeled   Toilet Transfer Independence verbal cues required;minimum assist (75% patient effort);1 person + 1 person to manage equipment   Toilet Transfer Assist Device grab bar   Transfer Safety Issues balance decreased during turns;step length decreased;weight-shifting ability decreased   Transfer Impairments balance impaired;coordination impaired;endurance;flexibility decreased;postural control impaired;strength  decreased   Transfer Comment cues for safe hand placement prior to  standing/sitting; performed x 3 trials to increase activity tolerance   Gait Assessment/Treatment   Independence  verbal cues required;minimum assist (75% patient effort);1 person + 1 person to manage equipment   Assistive Device  walker, front wheeled   Distance in Feet 20 feet x 2   Gait Speed decreased   Deviations  cadence decreased;step length decreased;stride length decreased;weight-shifting ability decreased   Safety Issues  balance decreased during turns;sequencing ability decreased;step length decreased;weight-shifting ability decreased   Impairments  balance impaired;coordination impaired;endurance;flexibility decreased;postural control impaired;strength decreased   Comment cues for posture, pacing, walker management; pt required seated rest break in between each gait trial d/t fatigue   Balance Skill Training   Sitting Balance: Static fair + balance   Sitting, Dynamic (Balance) fair balance   Sit-to-Stand Balance fair - balance   Standing Balance: Static fair - balance   Standing Balance: Dynamic fair - balance   Systems Impairment Contributing to Balance Disturbance neuromuscular;musculoskeletal   Identified Impairments Contributing to Balance Disturbance impaired coordination;pain;impaired postural control;decreased strength;other (see comments)  (decreased activity tolerance)   Post Treatment Status   Post Treatment Patient Status Patient sitting in bedside chair or w/c;Call light within reach;Telephone within reach;Sitter select activated   Support Present Post Treatment  Family present   Communication Post Treatement Nurse   Plan of Care Review   Plan Of Care Reviewed With patient;family   Physical Therapy Clinical Impression   Assessment Good tolerance to PT treatment this date. Pt able to increase ambulated distance with use of FWW. Limited d/t pain and dizziness. Continues to present with decreased activity tolerance, endurance and strength. Will follow.    Anticipated Equipment Needs at Discharge  (PT) TBD   Anticipated Discharge Disposition skilled nursing facility       Therapist:   Kirk Ruthsvelina Therron Sells, [place]PTA   Pager #: [phone]

## 2018-02-08 NOTE — Care Plan (Signed)
[facility]  Rehabilitation Services  Occupational Therapy Progress Note    Patient Name: Tasha [name] Retzloff  Date of Birth: 05/11/1928  Height:  160 cm (5' 2.99")  Weight:  62 kg (136 lb 11 oz)  Room/Bed: 22/A  Payor: MEDICARE / Plan: MEDICARE PART A AND B / Product Type: Medicare /     Assessment:    Pt with good participation and motivation for addressing grooming, UB dressing, toileting, transfer training, bed mobility, edge of bed sittng tolerance and functional mobility training tasks using FWW. At this time it seems that pt would benefit from continued care from a SNF level of care for improving self care independence.      Discharge Needs:   Equipment Recommendation: to be determined    Discharge Disposition:  skilled nursing facility     JUSTIFICATION OF DISCHARGE RECOMMENDATION   Based on current diagnosis, functional performance prior to admission, and current functional performance, this patient requires continued OT services in skilled nursing facility in order to achieve significant functional improvements.    Plan:   Continue to follow patient according to established plan of care.  The risks/benefits of therapy have been discussed with the patient/caregiver and he/she is in agreement with the established plan of care.     Subjective & Objective:        02/08/18 0831   Therapist Pager   OT Assigned/ Pager # Barbara Cowerjason [phone]   Rehab Session   Document Type therapy progress note (daily note)   Total OT Minutes: 32   Patient Effort good   Symptoms Noted During/After Treatment dizziness   Symptoms Noted Comment when coming supine to sitting   General Information   Patient Profile Reviewed? yes   Medical Lines PIV Line;Telemetry   Respiratory Status room air   Existing Precautions/Restrictions contact isolation;DNR;fall precautions   Pre Treatment Status   Pre Treatment Patient Status Patient supine in bed;Call light within reach;Telephone within reach;Sitter select activated   Support Present Pre  Treatment  Family present   Communication Pre Treatment  Nurse   Mutuality/Individual Preferences   Individualized Care Needs OOB with 2 person assist   Pain Assessment   Pre/Post Treatment Pain Comment zero pain stated when asked   Coping/Psychosocial Response Interventions   Plan Of Care Reviewed With patient;family   Cognitive Assessment/Interventions   Behavior/Mood Observations behavior appropriate to situation, WNL/WFL;alert;cooperative   Bed Mobility Assessment/Treatment   Bed Mobility, Assistive Device bed rails;Head of Bed Elevated;draw sheet   Supine-Sit Independence minimum assist (75% patient effort);verbal cues required   Safety Issues decreased use of arms for pushing/pulling;decreased use of legs for bridging/pushing;impaired trunk control for bed mobility   Impairments balance impaired;coordination impaired;endurance;ROM decreased;strength decreased   Transfer Assessment/Treatment   Sit-Stand Independence 2 person assist required;minimum assist (75% patient effort);verbal cues required   Stand-Sit Independence 2 person assist required;minimum assist (75% patient effort);verbal cues required   Sit-Stand-Sit, Assist Device walker, front wheeled   Bed-Chair Independence verbal cues required   Bed-Chair-Bed Assist Device walker, front wheeled   Toilet Transfer Independence minimum assist (75% patient effort);1 person + 1 person to manage equipment;verbal cues required   Toilet Transfer Assist Device grab bar   Transfer Safety Issues balance decreased during turns;step length decreased;weight-shifting ability decreased   Transfer Impairments balance impaired;coordination impaired;endurance;flexibility decreased;postural control impaired;strength decreased   Upper Body Dressing Assessment/Training   Position  sitting   Independence Level  minimum assist (75% patient effort);verbal cues required   Comment for donning  gown   Lower Body Dressing Assessment/Training   Position sitting   Independence Level   contact guard assist;minimum assist (75% patient effort);verbal cues required   Comment for clothing management and hygiene associated with toileting tasks   Grooming Assessment/Training   Position sitting   Independence Level contact guard assist;verbal cues required;standby assist   Comment for brushing hair and washing face and hands   Balance Skill Training   Sitting Balance: Static fair + balance   Sitting, Dynamic (Balance) fair balance   Sit-to-Stand Balance fair - balance   Standing Balance: Static fair - balance   Standing Balance: Dynamic fair - balance   Post Treatment Status   Post Treatment Patient Status Patient sitting in bedside chair or w/c;Call light within reach;Telephone within reach;Sitter select activated   Support Present Post Treatment  Family present   Film/video editor Nurse   Occupational Therapy Clinical Impression   Functional Level at Time of Session Pt with good participation and motivation for addressing grooming, UB dressing, toileting, transfer training, bed mobility, edge of bed sittng tolerance and functional mobility training tasks using FWW. At this time it seems that pt would benefit from continued care from a SNF level of care for improving self care independence.   Anticipated Equipment Needs at Discharge to be determined   Anticipated Discharge Disposition skilled nursing facility       Therapist:   Novella Olive, Denman George  Pager #: [phone]

## 2018-02-09 LAB — CBC WITH DIFF
BASOPHIL #: 0.05 x10ˆ3/uL (ref 0.00–0.20)
BASOPHIL %: 1 %
EOSINOPHIL #: 0.09 x10ˆ3/uL (ref 0.00–0.50)
EOSINOPHIL %: 1 %
HCT: 37.7 % (ref 33.5–45.2)
HGB: 12.4 g/dL (ref 11.2–15.2)
LYMPHOCYTE #: 2.28 x10ˆ3/uL (ref 1.00–4.80)
LYMPHOCYTE %: 27 %
MCH: 29.6 pg (ref 27.4–33.0)
MCHC: 32.8 g/dL (ref 32.5–35.8)
MCV: 90.2 fL (ref 78.0–100.0)
MONOCYTE #: 0.91 x10ˆ3/uL (ref 0.30–1.00)
MONOCYTE %: 11 %
MPV: 10.1 fL (ref 7.5–11.5)
NEUTROPHIL #: 5.04 x10ˆ3/uL (ref 1.50–7.70)
NEUTROPHIL %: 60 %
PLATELETS: 204 10*3/uL (ref 140–450)
RBC: 4.18 x10?6/uL (ref 3.63–4.92)
RBC: 4.18 x10ˆ6/uL (ref 3.63–4.92)
RDW: 13.6 % (ref 12.0–15.0)
WBC: 8.4 10*3/uL (ref 3.5–11.0)

## 2018-02-09 LAB — CLOSTRIDIUM DIFFICILE TOXIN DETECTION: CLOSTRIDIUM DIFFICILE TOXIN DETECTION: NEGATIVE

## 2018-02-09 LAB — BASIC METABOLIC PANEL
ANION GAP: 10 mmol/L (ref 4–13)
BUN/CREA RATIO: 29 — ABNORMAL HIGH (ref 6–22)
BUN: 31 mg/dL — ABNORMAL HIGH (ref 8–25)
CALCIUM: 9.2 mg/dL (ref 8.5–10.2)
CHLORIDE: 103 mmol/L (ref 96–111)
CO2 TOTAL: 23 mmol/L (ref 22–32)
CREATININE: 1.07 mg/dL (ref 0.49–1.10)
ESTIMATED GFR: 49 mL/min/1.73mˆ2 — ABNORMAL LOW (ref >59)
GLUCOSE: 104 mg/dL (ref 65–139)
POTASSIUM: 3.9 mmol/L (ref 3.5–5.1)
SODIUM: 136 mmol/L (ref 136–145)

## 2018-02-09 MED ORDER — ACETAMINOPHEN 325 MG TABLET
650.00 mg | ORAL_TABLET | ORAL | 0 refills | Status: AC | PRN
Start: 2018-02-09 — End: ?

## 2018-02-09 MED ORDER — LEVETIRACETAM 250 MG TABLET
250.00 mg | ORAL_TABLET | Freq: Two times a day (BID) | ORAL | 0 refills | Status: AC
Start: 2018-02-09 — End: 2018-02-11

## 2018-02-09 MED ORDER — MAGNESIUM OXIDE 400 MG (241.3 MG MAGNESIUM) TABLET
400.0000 mg | ORAL_TABLET | Freq: Every day | ORAL | Status: AC
Start: 2018-02-09 — End: ?

## 2018-02-09 MED ORDER — LOPERAMIDE 2 MG CAPSULE
2.0000 mg | ORAL_CAPSULE | ORAL | Status: DC | PRN
Start: 2018-02-09 — End: 2018-02-09
  Filled 2018-02-09: qty 1

## 2018-02-09 MED ORDER — LOPERAMIDE 2 MG CAPSULE: 2 mg | ORAL | 0 refills | 0 days | Status: AC | PRN

## 2018-02-09 MED ORDER — LOPERAMIDE 2 MG CAPSULE
2.00 mg | ORAL_CAPSULE | ORAL | Status: AC | PRN
Start: 2018-02-09 — End: ?

## 2018-02-09 NOTE — Care Management Notes (Signed)
[facility]  Care Management Note    Patient Name: Tasha HesselbachLola P [name]  Date of Birth: 09/03/1928  Sex: female  Date/Time of Admission: 02/03/2018  2:03 PM  Room/Bed: 22/A  Payor: MEDICARE / Plan: MEDICARE PART A AND B / Product Type: Medicare /    LOS: 6 days   Primary Care Providers:  Rolm BookbinderHare, Julie D, MD, MD (General)    Admitting Diagnosis:  [facility] [Z61.0R6E][S06.5X9A]    Assessment:      02/09/18 1039   Discharge Information   Discharge Disposition skilled nursing facility   Skilled Nursing Facility [facility] - Swing Bed   Lay Caregiver Notified of Discharge Yes   Lay Caregiver Name Neldon LabellaBarbara Circosta   Lay Caregiver Date Notified 02/09/18   Lay Caregiver Time Notified 1000   3 Day Stay Verified: Yes   Discharge Date 02/09/18   Transport Type   Transport Mode Ground   Transport Level of Care Basic Life Support   Time Log   Transport Request Date 02/09/18   Transport Request Time 1230     Spoke with service and pt ready for discharge. Spoke with Kathlene NovemberMike at [facility] [phone]) who stated pt could come anytime. Spoke with Britta MccreedyBarbara -daughter/MPOA [phone]) and made her aware of the discharge and confirmed that she wanted the pt to be transported via ambulance. She was aware there will likely be a bill involved. DNR card was also completed. Verbal consent was obtained by Neldon LabellaBarbara Circosta and witnessed by MSW and MSW Ice. Pt also signed DNR card. Task was submitted for ambulance transport.  Floor nurse made aware of discharge.  IMM was also given. Signed copy on chart.     Discharge Plan:  Swing Bed-Hospital Based (code 4761)  [facility]     The patient will continue to be evaluated for developing discharge needs.     Case Manager: Rocky LinkJessica Lenola Lockner, SOCIAL WORKER  Phone: [phone]

## 2018-02-09 NOTE — Care Plan (Signed)
Patient OOB with assist x2. Tolerating a regular diet. Voiding adequate amounts without difficulty. LBM 02/09/2018. Pain controlled with prn tylenol and HA cocktail.  Patient due to discharge to [facility] skilled unit today via ambulance. Patient instructed to call out if help needed, call bell within reach. Will continue to monitor patient.

## 2018-02-09 NOTE — Progress Notes (Signed)
[facility]  Medicine Progress Note  No CPR    Dua Fanny Dance  Date of service: 02/09/2018    Subjective:   Patient endorses having continued right-sided headache that is stable since fall. She had diarrhea yesterday but has not had BM so far this morning. C. Diff assay was negative. She also does endorse still being imbalanced when up. Denies chest pain and nausea.    Vital Signs:  Temp (24hrs) Max:37.3 C (99.1 F)      Systolic (24hrs), Avg:125 , Min:113 , Max:135     Diastolic (24hrs), Avg:72, Min:65, Max:79    Temp  Avg: 36.9 C (98.5 F)  Min: 36.7 C (98.1 F)  Max: 37.3 C (99.1 F)  MAP (Non-Invasive)  Avg: 89.3 mmHG  Min: 81 mmHG  Max: 97 mmHG  Pulse  Avg: 96.4  Min: 87  Max: 106  Resp  Avg: 16.2  Min: 15  Max: 17  SpO2  Avg: 95.2 %  Min: 94 %  Max: 97 %  Pain Score (Numeric, Faces): 10    I/O:  I/O last 24 hours:      Intake/Output Summary (Last 24 hours) at 02/09/2018 0718  Last data filed at 02/09/2018 0530  Gross per 24 hour   Intake 790 ml   Output --   Net 790 ml     I/O current shift:  No intake/output data recorded.      Current Facility-Administered Medications:  acetaminophen (TYLENOL) tablet 650 mg Oral Q4H PRN   albuterol (PROVENTIL) 2.5 mg / 3 mL (0.083%) neb solution 2.5 mg Nebulization Q4H PRN   atenolol (TENORMIN) tablet 50 mg Oral Daily   banana flakes (BANATROL PLUS) packet 1 Packet Oral 3x/day PRN   caffeine tablet 200 mg Oral Daily PRN   diphenhydrAMINE (BENADRYL) 50 mg/mL injection 12.5 mg Intravenous Q6H PRN   And      prochlorperazine (COMPAZINE) 5 mg/mL injection 5 mg Intravenous Q6H PRN   lanolin-oxyquin-pet, hydrophil (BAG BALM) topical ointment  Apply Topically 3x/day PRN   levETIRAcetam (KEPPRA) tablet 250 mg Oral 2x/day   levothyroxine (SYNTHROID) tablet 125 mcg Oral QAM   memantine (NAMENDA) tablet 10 mg Oral 2x/day   mirtazapine (REMERON) tablet 30 mg Oral NIGHTLY   NS flush syringe 2 mL Intracatheter Q8HRS   And      NS flush syringe 2-6 mL Intracatheter Q1 MIN PRN    nystatin (NYSTOP) 100,000 units/g topical powder  Apply Topically 3x/day PRN   ondansetron (ZOFRAN) 2 mg/mL injection 4 mg Intravenous Q6H PRN   travoprost (TRAVATAN) 0.004% ophthalmic solution 1 Drop Both Eyes NIGHTLY       Allergies   Allergen Reactions   . Furadantin [Nitrofurantoin]    . Morphine        Physical Exam:  General: frail, chronically ill appearing, sitting in chair, no distress  HENT: head atraumatic and normocephalic, moist oral mucosa  Lungs: clear to auscultation bilaterally, no crackles or wheezes  Cardiovascular: regular rate and rhythm, no murmurs, no rubs, no gallops  Abdomen: soft, non-tender, non-distended, bowel sounds present  Extremities: no cyanosis or edema  Skin: skin warm and dry, no rashes  Neurologic: alert, responding appropriately, no gross deficits    Labs:  I have reviewed all lab results.  Lab Results Today:    Results for orders placed or performed during the hospital encounter of 02/03/18 (from the past 24 hour(s))   CLOSTRIDIUM DIFFICILE TOXIN DETECTION   Result Value Ref Range  CLOSTRIDIUM DIFFICILE TOXIN DETECTION Negative Negative, Indeterminate   BASIC METABOLIC PANEL   Result Value Ref Range    SODIUM 136 136 - 145 mmol/L    POTASSIUM 3.9 3.5 - 5.1 mmol/L    CHLORIDE 103 96 - 111 mmol/L    CO2 TOTAL 23 22 - 32 mmol/L    ANION GAP 10 4 - 13 mmol/L    CALCIUM 9.2 8.5 - 10.2 mg/dL    GLUCOSE 962104 65 - 952139 mg/dL    BUN 31 (H) 8 - 25 mg/dL    CREATININE 8.411.07 3.240.49 - 1.10 mg/dL    BUN/CREA RATIO 29 (H) 6 - 22    ESTIMATED GFR 49 (L) >59 mL/min/1.2175m^2   CBC WITH DIFF   Result Value Ref Range    WBC 8.4 3.5 - 11.0 x10^3/uL    RBC 4.18 3.63 - 4.92 x10^6/uL    HGB 12.4 11.2 - 15.2 g/dL    HCT 40.137.7 02.733.5 - 25.345.2 %    MCV 90.2 78.0 - 100.0 fL    MCH 29.6 27.4 - 33.0 pg    MCHC 32.8 32.5 - 35.8 g/dL    RDW 66.413.6 40.312.0 - 47.415.0 %    PLATELETS 204 140 - 450 x10^3/uL    MPV 10.1 7.5 - 11.5 fL    NEUTROPHIL % 60 %    LYMPHOCYTE % 27 %    MONOCYTE % 11 %    EOSINOPHIL % 1 %    BASOPHIL % 1  %    NEUTROPHIL # 5.04 1.50 - 7.70 x10^3/uL    LYMPHOCYTE # 2.28 1.00 - 4.80 x10^3/uL    MONOCYTE # 0.91 0.30 - 1.00 x10^3/uL    EOSINOPHIL # 0.09 0.00 - 0.50 x10^3/uL    BASOPHIL # 0.05 0.00 - 0.20 x10^3/uL       Radiology:   I have reviewed imaging studies  Results for orders placed or performed during the hospital encounter of 02/03/18 (from the past 72 hour(s))   CT BRAIN WO IV CONTRAST     Status: None    Narrative    Tasha [name]  Female, 84 years old.    CT BRAIN WO IV CONTRAST performed on 02/07/2018 9:12 AM.    REASON FOR EXAM:  fall, head trauma    RADIATION DOSE: 1172.40 mGycm    TECHNIQUE: Noncontrast CT of the head.    COMPARISON: February 03, 2018.    FINDINGS:  The previously seen right convexity subdural hematoma is  unchanged. There is a tiny amount of adjacent subarachnoid hemorrhage which  is also unchanged. No new intracranial hemorrhage or extra axial fluid  collection is identified. No calvarial fractures seen. There is unchanged  periventricular white matter lucency. A remote right PCA territory infarct  is unchanged as well.      Impression    Unchanged small right convexity subdural hematoma.     CT CERVICAL SPINE WO IV CONTRAST     Status: None    Narrative    Marlaine P Milliron  Female, 84 years old.    CT CERVICAL SPINE WO IV CONTRAST performed on 02/07/2018 9:13 AM.    REASON FOR EXAM:  fall, neck pain    RADIATION DOSE: 469.30 mGycm    TECHNIQUE: Noncontrast CT of the cervical spine    COMPARISON: None available.    FINDINGS:  No cervical spinal fracture or traumatic subluxation is  identified. There is mild anterolisthesis of C4 on C5 likely related to  facet arthropathy.  There is multilevel facet arthropathy, most advanced at  C3-C4 and C4-C5. There is moderate disc space height loss at C5-C6 and  C6-C7. No prevertebral soft tissue thickening is seen. Is biapical pleural  parenchymal scarring and thickening in the lungs. There is some  calcification at the ligamentum flavum at C7.         Impression     IMPRESSION:   No fracture traumatic malalignment is identified.         PT/OT: Yes    Consults: NSGY, trauma    Assessment/ Plan:   Active Hospital Problems    Diagnosis   . Small R SDH without MLS     84 year [facility] femalew[facility], CAD, CVA, andHTN who was admitteds/p fall and found to havea smallrightconvexity SDH.    SAH / SDH after Mechanical Fall / Headache  - NSGY consulted, no surgical intervention  - CT Brain with stable hemorrhage after fall inpatient which appears mechanical in nature  - cleared from trauma perspective by consult service with removal of Aspen collar on 2/22  - continue Keppra seizure PPX through 02/10/18  - continue to avoid anticoagulants and antiplatelet  - PT/OT--SNF placement  - for HA, continue tylenol, caffeine tablet, and PRN benadryl + compazine    Diarrhea -- improved  - C. Diff assay negative  - on banana flakes  - continue Imodium PRN    ESBL UTI, present on admission   - completed course with fosfomycin x 3 on 2/26 after previously being on ertapenem    CAD / HTN - continue beta blocker, stopped ASA (see above)  Dementia - continue namenda 10 mg BID  Hypothryoidism - continue Synthroid 125 mcg daily  Physical Deconditioning - PT/OT--SNF placement    DVT/PE Prophylaxis: SCDs/ Venodynes/Impulse boots  Disposition Planning: Skilled Nursing Unit     More than 30 minutes of time was spent on counseling, medicine reconciliation, and planning/arranging for discharge.      Asencion Noble, MD 17:39 02/09/2018  Hospitalist  [facility]  [phone]

## 2018-02-09 NOTE — Nurses Notes (Signed)
Patient will be discharged to [facility] skilled [facility]. Stable for discharge. Patient has all personal belongings. Nursing report called to Melina FiddlerEricka Faulkner,RN. Awaiting ambulance transport.

## 2018-02-09 NOTE — Discharge Instructions (Signed)
Discharge Recommendations/ Plan:Discharge to:SNF Placement (Medicare certified) (code 3)      Resources: NURSING TO CALL REPORT TO [facility] @ [phone] EXT 48 ASK FOR ERICA

## 2018-02-09 NOTE — Nurses Notes (Signed)
Patient discharged  With EMS transport. Patietn is alert and oriented.

## 2018-02-09 NOTE — Discharge Summary (Signed)
[facility]  DISCHARGE SUMMARY    PATIENT NAME:  Tasha [name], Tasha [name]  MRN:  [id number]  DOB:  10/25/1928    ENCOUNTER DATE:  02/03/2018  INPATIENT ADMISSION DATE: 02/03/2018  DISCHARGE DATE:  02/09/2018    ATTENDING PHYSICIAN: Azucena FreedBhushan, Elsa Ploch Panchal*  SERVICE: HOSPITALIST 5  PRIMARY CARE PHYSICIAN: Rolm BookbinderJulie D Hare, MD     Has PCP been verified with patient and updated? Yes    LAY CAREGIVER: Lay Caregiver Name: Denton ArBarbara Circosta, Lay Caregiver Contact Number: [phone] or [phone], Lay Caregiver Relationship to patient: family member      PRIMARY DISCHARGE DIAGNOSIS:   Active Hospital Problems    Diagnosis Date Noted   . Small R SDH without MLS 02/03/2018      Resolved Hospital Problems   No resolved problems to display.     There are no active non-hospital problems to display for this patient.       DISCHARGE MEDICATIONS:     Current Discharge Medication List      CONTINUE these medications - NO CHANGES were made during your visit.      Details   aspirin 81 mg Tablet, Delayed Release (E.C.)  Commonly known as:  ECOTRIN   81 mg, Oral, DAILY  Refills:  0     atenolol 50 mg Tablet  Commonly known as:  TENORMIN   50 mg, Oral, DAILY  Refills:  0     calcium carbonate 500 mg calcium (1,250 mg) Tablet   500 mg, Oral, DAILY  Refills:  0     cholestyramine-aspartame 4 gram Powder in Packet  Commonly known as:  PREVALITE   4 g, Oral, EVERY EVENING WITH DINNER  Refills:  0     estradiol 0.01 % (0.1 mg/gram) Cream  Commonly known as:  ESTRACE   2 g, Vaginal, EVERY MO, WE AND FR  Qty:  42.5 g  Refills:  5     gabapentin 100 mg Capsule  Commonly known as:  NEURONTIN   100 mg, Oral  Refills:  0     levothyroxine 125 mcg Tablet  Commonly known as:  SYNTHROID   125 mcg, Oral, EVERY MORNING  Refills:  0     magnesium oxide 400 mg (241.3 mg magnesium) Tablet  Commonly known as:  MAG-OX   400 mg, Oral, 2 TIMES DAILY  Refills:  0     memantine 10 mg Tablet  Commonly known as:  NAMENDA   10 mg, Oral, 2 TIMES DAILY  Refills:  0        mirabegron 50 mg Tablet Sustained Release 24 hr  Commonly known as:  MYRBETRIQ   50 mg, Oral, DAILY  Qty:  30 Tab  Refills:  2     oxyCODONE-acetaminophen 7.5-325 mg Tablet  Commonly known as:  PERCOCET   1 Tab, Oral, EVERY 4 HOURS PRN  Refills:  0     PROBIOTIC BLEND ORAL   Oral  Refills:  0     REMERON 30 mg Tablet  Generic drug:  mirtazapine   30 mg, Oral, NIGHTLY  Refills:  0     Vitamin D-3 5,000 unit Tablet  Generic drug:  Cholecalciferol (Vitamin D3)   Oral, EVERY 7 DAYS  Refills:  0     XALATAN 0.005 % Drops  Generic drug:  latanoprost   1 Drop, Both Eyes, EVERY EVENING  Refills:  0          Discharge med list refreshed?  YES  ALLERGIES:  Allergies   Allergen Reactions   . Furadantin [Nitrofurantoin]    . Morphine        During this hospitalization did the patient have an AMI, PCI/PCTA, STENT or Isolated CABG?  No                  HOSPITAL PROCEDURE(S):   Bedside Procedures:  No orders of the defined types were placed in this encounter.    Surgical     REASON FOR HOSPITALIZATION AND HOSPITAL COURSE     BRIEF HPI:  This is a 84 y.o., female admitted from [facility] after a witnessed fall with sustained traumatic R. Convexity / Frontal SAH/SDH with minimal midline shift seen on CT imaging. She was initially admitted to [facility] under NSGY service.    BRIEF HOSPITAL NARRATIVE:   Fall appeared consistent with syncope. Trauma service was also consulted. CT C-spine and C/A/[name] were negative for additional injury. Aspen collar was discontinued after trauam clearance on 2/22. Patient was started on 7-day Keppra PPx. Antiplatelet (and anticoagulation) were avoided. Patient was found to have uncomplicated UTI which was treated with ertapenem and then course completed with fosfomycin. TTE was without significant pathology. Patient had cough without evident PNA on imaging. Patient had fall in room when unsupervised after losing balance. Repeat CT Brain and C-spine showed stable findings. She also experienced diarrhea  treated with banana flakes, with C. Diff assay returning negative. Patient was afebrile and vitally stable at discharge to SNF.    TRANSITION/POST DISCHARGE CARE/PENDING TESTS/REFERRALS:  - NSGY f/u with repeat CT Brain in 4 weeks    CONDITION ON DISCHARGE:  A. Ambulation: Up with assistance only  B. Self-care Ability: With partial assistance  C. Cognitive Status Alert and Oriented x 3  D. Code status at discharge:   Code Status Information     Code Status    No CPR                 LINES/DRAINS/WOUNDS AT DISCHARGE:   Patient Lines/Drains/Airways Status    Active Line / Dialysis Catheter / Dialysis Graft / Drain / Airway / Wound     Name: Placement date: Placement time: Site: Days:    Peripheral IV Right Wrist  02/08/18   2130   less than 1                DISCHARGE DISPOSITION:  Skilled Nursing Unit              DISCHARGE INSTRUCTIONS:  Follow-up Information     Neurosurgery Clinic, Physician Office [facility] .    Specialty:  Neurosurgery  Contact information:  [address] [facility] [address]  [place] [address]  [phone]  Additional information:  For driving directions to the Physician Office Center in [place], please call [phone]). You may also visit our website at [email or web address]*Valet parking is available to patients at [facility] outpatient clinics for free and tipping is not required.*Visitors to our main campus will Radio producer as we are expanding to better serve you. We apologize for any inconvenience this may cause and appreciate your patience.                  CT BRAIN WO IV CONTRAST    Please schedule in 4 weeks in conjunction with the neurosurgery follow up appt.     Ordering Provider Levan Hurst [phone]    What is the patient's weight in lbs? 62.1 kg (136 lb 14.4 oz) (02/03/2018)  DISCHARGE INSTRUCTION - MISC    Please hold all anti-coagulant and anti-platelet medications until cleared by neurosurgery in follow-up. This includes Aspirin,  Aspirin-containing products, Clopidogrel (Plavix), Brilinta, Pradaxa, Warfarin (Coumadin), Therapeutic Lovenox, Therapeutic Heparin.     SCHEDULE FOLLOW-UP NEUROSURGERY - PHYSICIAN OFFICE CENTER    Please schedule with CT brain     Follow-up in: 4 WEEKS    Reason for visit: HOSPITAL DISCHARGE    Follow-up reason: SDH    Provider: Jean Rosenthal, MD      Copies sent to Care Team       Relationship Specialty Notifications Start End    Rolm Bookbinder, MD PCP - General EXTERNAL  02/02/18     Phone: [phone] Fax: [phone]         [address] [place] [address]          Referring providers can utilize [email or web address] to access their referred [facility] patient's information.

## 2018-02-09 NOTE — Care Plan (Signed)
[facility]  Rehabilitation Services  Physical Therapy Progress Note      Patient Name: Tasha [name]  Date of Birth: 01-05-1928  Height:  160 cm (5' 2.99")  Weight:  62 kg (136 lb 11 oz)  Room/Bed: 22/A  Payor: MEDICARE / Plan: MEDICARE PART A AND B / Product Type: Medicare /     Assessment:     Good tolerance to PT treatment this date. Pt continues to progress towards goals. Able to increase ambulated distance with use of FWW. Requires encouragement to increase ambulated distance and activity. Will follow.     Discharge Needs:   Equipment Recommendation: TBD    Discharge Disposition: skilled nursing facility    JUSTIFICATION OF DISCHARGE RECOMMENDATION   Based on current diagnosis, functional performance prior to admission, and current functional performance, this patient requires continued PT services in skilled nursing facility in order to achieve significant functional improvements in these deficit areas: aerobic capacity/endurance, ergonomics and body mechanics, gait, locomotion, and balance, muscle performance.      Plan:   Continue to follow patient according to established plan of care.  The risks/benefits of therapy have been discussed with the patient/caregiver and he/she is in agreement with the established plan of care.     Subjective & Objective:        02/09/18 1610   Therapist Pager   PT Assigned/ Pager # ev [phone]   Rehab Session   Document Type therapy progress note (daily note)   Total PT Minutes: 14   Patient Effort good   Symptoms Noted During/After Treatment dizziness;fatigue   Symptoms Noted Comment with standing and ambulation   General Information   Patient Profile Reviewed? yes   Patient/Family/Caregiver Comments/Observations Pt seen bedside this date, seated in recliner, RN approved session, seen with professional assist of OT   Medical Lines PIV Line;Telemetry   Respiratory Status room air   Existing Precautions/Restrictions contact isolation;DNR;fall precautions      Mutuality/Individual Preferences   Individualized Care Needs OOB with 1 person assist and FWW; chair follow for ambulation   Pre Treatment Status   Pre Treatment Patient Status Patient sitting in bedside chair or w/c;Call light within reach;Telephone within reach;Sitter select activated;Nurse approved session   Support Present Pre Treatment  None   Communication Pre Treatment  Nurse   Pain Assessment   Pre/Post Treatment Pain Comment reports headache - did not rate   Bed Mobility Assessment/Treatment   Comment pt up in chair pre/post session   Transfer Assessment/Treatment   Sit-Stand Independence verbal cues required;minimum assist (75% patient effort)   Stand-Sit Independence verbal cues required;minimum assist (75% patient effort)   Sit-Stand-Sit, Assist Device walker, front wheeled   Transfer Safety Issues balance decreased during turns;sequencing ability decreased;step length decreased;weight-shifting ability decreased   Transfer Impairments balance impaired;endurance;flexibility decreased;pain;strength decreased   Transfer Comment cues for safe hand placement prior to standing/sitting; performed x 2 trials to increase activity tolerance   Gait Assessment/Treatment   Independence  verbal cues required;minimum assist (75% patient effort);1 person + 1 person to manage equipment   Assistive Device  walker, front wheeled   Distance in Feet 50 feet   Gait Speed decreased   Deviations  cadence decreased;step length decreased;stride length decreased;weight-shifting ability decreased   Safety Issues  balance decreased during turns;sequencing ability decreased;step length decreased;weight-shifting ability decreased   Impairments  balance impaired;coordination impaired;endurance;flexibility decreased;postural control impaired;strength decreased   Comment cues for posture, pacing, walker management; encouragement provided to increase ambulated distance  Balance Skill Training   Sitting Balance: Static fair + balance    Sitting, Dynamic (Balance) fair balance   Sit-to-Stand Balance fair balance   Standing Balance: Static fair balance   Standing Balance: Dynamic fair - balance   Systems Impairment Contributing to Balance Disturbance neuromuscular;musculoskeletal   Identified Impairments Contributing to Balance Disturbance impaired coordination;pain;impaired postural control;decreased strength;other (see comments)  (decreased activity tolerance)   Post Treatment Status   Post Treatment Patient Status Patient sitting in bedside chair or w/c;Call light within reach;Telephone within reach;Sitter select activated   Support Present Post Treatment  None   Communication Post Treatement Nurse   Plan of Care Review   Plan Of Care Reviewed With patient   Physical Therapy Clinical Impression   Assessment Good tolerance to PT treatment this date. Pt continues to progress towards goals. Able to increase ambulated distance with use of FWW. Requires encouragement to increase ambulated distance and activity. Will follow.    Anticipated Equipment Needs at Discharge (PT) TBD   Anticipated Discharge Disposition skilled nursing facility       Therapist:   Kirk Ruthsvelina Harlin Mazzoni, [place]   Pager #: [phone]

## 2018-02-09 NOTE — Care Plan (Signed)
[facility]  Rehabilitation Services  Occupational Therapy Progress Note    Patient Name: Tasha [name]  Date of Birth: [birth year]  Height:  160 cm (5' 2.99")  Weight:  62 kg (136 lb 11 oz)  Room/Bed: 22/A  Payor: MEDICARE / Plan: MEDICARE PART A AND B / Product Type: Medicare /     Assessment:    Pt with good participation and eager to be d/c'd soon. Pt participated in UB dressing, grooming, transfer training and functional mobility training tasks using FWW. At this time it seems that pt would benefit from continued care from a SNF level of care for improving self care independence.      Discharge Needs:   Equipment Recommendation: to be determined      Discharge Disposition:  skilled nursing facility     JUSTIFICATION OF DISCHARGE RECOMMENDATION   Based on current diagnosis, functional performance prior to admission, and current functional performance, this patient requires continued OT services in skilled nursing facility in order to achieve significant functional improvements.    Plan:   Continue to follow patient according to established plan of care.  The risks/benefits of therapy have been discussed with the patient/caregiver and he/she is in agreement with the established plan of care.     Subjective & Objective:        02/09/18 1610   Therapist Pager   OT Assigned/ Pager # Barbara Cower [phone]   Rehab Session   Document Type therapy progress note (daily note)   Total OT Minutes: 24   Patient Effort good   Symptoms Noted During/After Treatment dizziness;fatigue   Symptoms Noted Comment with standing and ambulation training    General Information   Patient Profile Reviewed? yes   Medical Lines PIV Line;Telemetry   Respiratory Status room air   Existing Precautions/Restrictions contact isolation;DNR;fall precautions   Pre Treatment Status   Pre Treatment Patient Status Patient sitting in bedside chair or w/c;Call light within reach;Telephone within reach;Sitter select activated   Support Present Pre Treatment   None   Communication Pre Treatment  Nurse   Mutuality/Individual Preferences   Individualized Care Needs OOB with FWW Ax1   Pain Assessment   Pre/Post Treatment Pain Comment zero to c/o pain when asked   Coping/Psychosocial Response Interventions   Plan Of Care Reviewed With patient;family   Cognitive Assessment/Interventions   Behavior/Mood Observations behavior appropriate to situation, WNL/WFL;alert;cooperative   Transfer Assessment/Treatment   Sit-Stand Independence minimum assist (75% patient effort);contact guard assist;verbal cues required   Stand-Sit Independence minimum assist (75% patient effort);contact guard assist;verbal cues required   Sit-Stand-Sit, Assist Device walker, front wheeled   Transfer Safety Issues balance decreased during turns;step length decreased;weight-shifting ability decreased   Transfer Impairments balance impaired;coordination impaired;endurance;flexibility decreased;postural control impaired;strength decreased   Upper Body Dressing Assessment/Training   Position  sitting   Independence Level  minimum assist (75% patient effort);verbal cues required   Comment for donning gown   Grooming Assessment/Training   Position sitting   Independence Level standby assist;set up required;verbal cues required   Comment for wahsing face and hands and brushing hair   Balance Skill Training   Sitting Balance: Static fair + balance   Sitting, Dynamic (Balance) fair balance   Sit-to-Stand Balance fair balance   Standing Balance: Static fair + balance   Standing Balance: Dynamic fair balance   Post Treatment Status   Post Treatment Patient Status Patient sitting in bedside chair or w/c;Call light within reach;Telephone within reach;Sitter select activated  Support Present Post Treatment  None   Communication Post Treatement Nurse   Occupational Therapy Clinical Impression   Functional Level at Time of Session Pt with good participation and eager to be d/c'd soon. Pt participated in UB dressing,  grooming, transfer training and functional mobility training tasks using FWW. At this time it seems that pt would benefit from continued care from a SNF level of care for improving self care independence.   Anticipated Equipment Needs at Discharge to be determined   Anticipated Discharge Disposition skilled nursing facility       Therapist:   Novella OliveRichard Euline Kimbler, Denman GeorgeCOTA  Pager #: [phone]

## 2018-02-10 NOTE — Care Management Notes (Signed)
Referral Information  ++++++ Placed Provider #1 ++++++  Case Manager: Rocky LinkJessica [name]  Provider Type: Nursing Home/SNF  Provider [facility]  Address:  [address]  [place], [place] [id number]  Contact:    Fax:   Fax:

## 2018-02-22 ENCOUNTER — Telehealth (HOSPITAL_BASED_OUTPATIENT_CLINIC_OR_DEPARTMENT_OTHER): Payer: Self-pay | Admitting: Medical

## 2018-02-22 NOTE — Telephone Encounter (Addendum)
Spoke with Daughter/Barbara Bethena MidgetCircosta [phone], patient would like to [facility] instead of Dr. Jeananne RamaIremashvili. Their next f/u appointment is scheduled for 05/03/18.

## 2018-03-01 ENCOUNTER — Ambulatory Visit (INDEPENDENT_AMBULATORY_CARE_PROVIDER_SITE_OTHER): Payer: Self-pay | Admitting: Neurological Surgery

## 2018-03-01 NOTE — Telephone Encounter (Signed)
Per Lilyona daughter she can not make this trip it is to far for her to come. mcgeel 03-01-18

## 2018-03-01 NOTE — Telephone Encounter (Addendum)
Regarding: Tasha [name] - NS referral closer to home  ----- Message from Ulyess Blossomeresa Lynn Sylvester sent at 02/25/2018  3:51 PM EDT -----  Rod HollerSedney  Asking if pt can be referred to a Neurosurgeon in the [place], [place], or [place] area.  States the pt has to travel 4 hrs in a wheelchair to get to [place].  States her mother is now in a nursing home.  Please advise.  Thanks,    -----------------------------------------------------  I left a message with Britta MccreedyBarbara to contact patient's PCP   For referral to a neurosurgeon in another area.   Joanette GulaKathleen D Tida Saner, RN  03/01/2018, 12:43

## 2018-03-03 ENCOUNTER — Other Ambulatory Visit (HOSPITAL_COMMUNITY): Payer: Self-pay

## 2018-03-03 ENCOUNTER — Encounter (INDEPENDENT_AMBULATORY_CARE_PROVIDER_SITE_OTHER): Payer: Self-pay | Admitting: Neurological Surgery

## 2018-05-03 ENCOUNTER — Encounter (HOSPITAL_BASED_OUTPATIENT_CLINIC_OR_DEPARTMENT_OTHER): Payer: Self-pay | Admitting: Urology

## 2019-10-31 ENCOUNTER — Other Ambulatory Visit (HOSPITAL_COMMUNITY): Payer: Self-pay

## 2019-10-31 LAB — EXTERNAL COVID-19 MOLECULAR RESULT: External 2019-n-CoV/SARS-CoV-2: POSITIVE — AB

## 2022-02-11 DEATH — deceased
